# Patient Record
Sex: Female | Born: 2000
Health system: Southern US, Community
[De-identification: ages and names within clinical notes are randomized; demographics above are authoritative.]

## PROBLEM LIST (undated history)

## (undated) ENCOUNTER — Ambulatory Visit (HOSPITAL_COMMUNITY): Admission: EM | Payer: Medicaid Other | Source: Home / Self Care

## (undated) DIAGNOSIS — R569 Unspecified convulsions: Secondary | ICD-10-CM

## (undated) DIAGNOSIS — O139 Gestational [pregnancy-induced] hypertension without significant proteinuria, unspecified trimester: Secondary | ICD-10-CM

## (undated) HISTORY — DX: Gestational (pregnancy-induced) hypertension without significant proteinuria, unspecified trimester: O13.9

## (undated) HISTORY — PX: SP ARTHRO THUMB*R*: HXRAD215

## (undated) HISTORY — DX: Unspecified convulsions: R56.9

---
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
## 2019-03-30 ENCOUNTER — Encounter: Payer: Self-pay | Admitting: Student in an Organized Health Care Education/Training Program

## 2019-03-30 ENCOUNTER — Ambulatory Visit (INDEPENDENT_AMBULATORY_CARE_PROVIDER_SITE_OTHER): Payer: Medicaid Other | Admitting: Student in an Organized Health Care Education/Training Program

## 2019-03-30 ENCOUNTER — Other Ambulatory Visit: Payer: Self-pay

## 2019-03-30 VITALS — BP 122/82 | HR 74 | Ht 65.0 in | Wt 136.2 lb

## 2019-03-30 DIAGNOSIS — Z0000 Encounter for general adult medical examination without abnormal findings: Secondary | ICD-10-CM | POA: Diagnosis not present

## 2019-03-30 NOTE — Progress Notes (Signed)
Adolescent Well Care Visit Sonya Harris is a 18 y.o. female who is here for well care.    PCP:  Gifford Shave, MD   History was provided by the patient.  Confidentiality was discussed with the patient and, if applicable, with caregiver as well.  Current Issues: Current concerns include None.   Nutrition: Nutrition/Eating Behaviors: Patient eats whatever she craves. She eats some vegetables and meat. She feels she could eat more vegetables. She feels she tolerates milk less than previously, with some bloating associated with milk. She does eat greek yogurt Adequate calcium in diet?: yes Supplements/ Vitamins: no  Exercise/ Media: Play any Sports?/ Exercise: yes Screen Time:  > 2 hours-counseling provided Media Rules or Monitoring?: yes  Sleep:  Sleep: Endorses some nighttime awakenings, she does nap during the day sometimes. Discussed sleep hygiene.  Social Screening: Lives with:  Mom, sister, two brothers Parental relations:  good Activities, Work, and Research officer, political party?: yes, chores and work Concerns regarding behavior with peers?  no Stressors of note: no  Education: School Name: [employer] Grade: going to be a Glass blower/designer: doing well; no concerns School Behavior: doing well; no concerns  Menstruation:   Patient's last menstrual period was 03/24/2019 (exact date). Menstrual History: regular cycles between 26 and 28 days  Confidential Social History: Tobacco?  no Secondhand smoke exposure?  yes Drugs/ETOH?  no  Sexually Active?  No, but discloses that she may become sexually active soon. Offered contraceptive counseling. Patient prefers to continue using condoms. Information for LARCs provided. Pregnancy Prevention: Condoms  Safe at home, in school & in relationships?  Yes Safe to self?  Yes   Screenings: Patient has a dental home: yes  Physical Exam:  Vitals:   03/30/19 1343  BP: 122/82  Pulse: 74  SpO2: 99%  Weight: 136 lb 3.2 oz (61.8  kg)  Height: 5\' 5"  (1.651 m)   BP 122/82   Pulse 74   Ht 5\' 5"  (1.651 m)   Wt 136 lb 3.2 oz (61.8 kg)   LMP 03/24/2019 (Exact Date)   SpO2 99%   BMI 22.66 kg/m  Body mass index: body mass index is 22.66 kg/m. Blood pressure percentiles are not available for patients who are 18 years or older.  No exam data present  General Appearance:   alert, oriented, no acute distress  HENT: Normocephalic, no obvious abnormality, conjunctiva clear  Mouth:   Normal appearing teeth, no obvious discoloration, dental caries, or dental caps  Neck:   Supple; thyroid: no enlargement, symmetric, no tenderness/mass/nodules  Chest RRR, no m/r/g  Lungs:   Clear to auscultation bilaterally, normal work of breathing  Heart:   Regular rate and rhythm, S1 and S2 normal, no murmurs;   Abdomen:   Soft, non-tender, no mass, or organomegaly  GU genitalia not examined  Musculoskeletal:   Tone and strength strong and symmetrical, all extremities               Lymphatic:   No cervical adenopathy  Skin/Hair/Nails:   Skin warm, dry and intact, no rashes, no bruises or petechiae  Neurologic:   Strength, gait, and coordination normal and age-appropriate     Assessment and Plan:   17yoF doing well. Discussed sleep hygiene today. Discussed contraceptive options and gave resources. Patient prefers to use condoms for now because she is concerned about how other contraceptive options may make her period lighter or heavier. She was advised to use condoms with every episode of intercourse and follow up  in our office if she decides she would like additional contraception.  BMI is appropriate for age   Return in 1 year (on 03/29/2020).Everrett Coombe, MD

## 2019-03-30 NOTE — Patient Instructions (Signed)
It was a pleasure seeing you today in our clinic.   Our clinic's number is [phone]. Please call with questions or concerns about what we discussed today.  Be well, Dr. Jionni Helming   

## 2019-11-26 ENCOUNTER — Encounter (HOSPITAL_COMMUNITY): Payer: Self-pay

## 2019-11-26 ENCOUNTER — Other Ambulatory Visit: Payer: Self-pay

## 2019-11-26 ENCOUNTER — Ambulatory Visit (HOSPITAL_COMMUNITY)
Admission: EM | Admit: 2019-11-26 | Discharge: 2019-11-26 | Disposition: A | Discharge status: Home / Self Care | Payer: Medicaid Other | Attending: Family Medicine | Admitting: Family Medicine

## 2019-11-26 DIAGNOSIS — R0789 Other chest pain: Secondary | ICD-10-CM | POA: Diagnosis not present

## 2019-11-26 DIAGNOSIS — F419 Anxiety disorder, unspecified: Secondary | ICD-10-CM

## 2019-11-26 DIAGNOSIS — R519 Headache, unspecified: Secondary | ICD-10-CM

## 2019-11-26 MED ORDER — HYDROXYZINE HCL 10 MG PO TABS: 10.0000 mg | ORAL_TABLET | Freq: Three times a day (TID) | ORAL | 0 refills | Status: DC | PRN

## 2019-11-26 NOTE — Discharge Instructions (Addendum)
You are experiencing an increase in anxiety. This can cause chest pain, and limb tingling as well.  I have sent in hydroxyzine 10mg  for anxiety three times daily as needed.   Follow up with primary care if symptoms persist or worsen.

## 2019-11-26 NOTE — ED Triage Notes (Signed)
Pt presents with intermittent chest pain that has been on her right side and left side since yesterday.

## 2019-11-26 NOTE — ED Provider Notes (Signed)
[place]    CSN: [id number] Arrival date & time: 11/26/19  [id number]      History   Chief Complaint Chief Complaint  Patient presents with  . Chest Pain    HPI Sonya Harris is a 19 y.o. female.   Reports intermittent chest pain since yesterday.  Reports that episodes only last a few seconds.  Reports that pain is sharp and quick.  Reports increased stress, has been midterms, is very anxious.  Does not take anything for anxiety.  Reports an increase in headaches lately, daily.  Reports that she has been taking 400 mg ibuprofen, and that this knocks out her headache.  Denies dizziness, shortness of breath, radiating pain, body aches, nausea, vomiting, diarrhea, chills, fever, rash, other symptoms.  ROS per HPI  The history is provided by the patient.    History reviewed. No pertinent past medical history.  Patient Active Problem List   Diagnosis Date Noted  . Allergic rhinitis 05/01/2012  . ECZEMA, ATOPIC DERMATITIS 11/20/2006    History reviewed. No pertinent surgical history.  OB History   No obstetric history on file.      Home Medications    Prior to Admission medications   Medication Sig Start Date End Date Taking? Authorizing Provider  amoxicillin (AMOXIL) 500 MG capsule Take 1 capsule (500 mg total) by mouth 3 (three) times daily. 01/16/15   Billy Fischer, MD  hydrOXYzine (ATARAX/VISTARIL) 10 MG tablet Take 1 tablet (10 mg total) by mouth 3 (three) times daily as needed. 11/26/19   Faustino Congress, NP  ibuprofen (ADVIL,MOTRIN) 200 MG tablet Take 400 mg by mouth every 6 (six) hours as needed for fever.    [provider]    Family History History reviewed. No pertinent family history.  Social History Social History   Tobacco Use  . Smoking status: Passive Smoke Exposure - Never Smoker  . Smokeless tobacco: Never Used  Substance Use Topics  . Alcohol use: Not on file  . Drug use: Not on file     Allergies   Patient has no known  allergies.   Review of Systems Review of Systems   Physical Exam Triage Vital Signs ED Triage Vitals  Enc Vitals Group     BP 11/26/19 0850 (!) 135/98     Pulse Rate 11/26/19 0850 76     Resp 11/26/19 0850 18     Temp 11/26/19 0850 98.4 F (36.9 C)     Temp Source 11/26/19 0850 Oral     SpO2 11/26/19 0850 98 %     Weight --      Height --      Head Circumference --      Peak Flow --      Pain Score 11/26/19 0852 6     Pain Loc --      Pain Edu? --      Excl. in [place]? --    No data found.  Updated Vital Signs BP (!) 135/98 (BP Location: Left Arm)   Pulse 76   Temp 98.4 F (36.9 C) (Oral)   Resp 18   LMP 11/12/2019   SpO2 98%   Visual Acuity Right Eye Distance:   Left Eye Distance:   Bilateral Distance:    Right Eye Near:   Left Eye Near:    Bilateral Near:     Physical Exam Vitals and nursing note reviewed.  Constitutional:      General: She is not in acute distress.  Appearance: She is well-developed. She is not ill-appearing.  HENT:     Head: Normocephalic and atraumatic.  Eyes:     Conjunctiva/sclera: Conjunctivae normal.  Cardiovascular:     Rate and Rhythm: Normal rate and regular rhythm.     Heart sounds: Normal heart sounds. Heart sounds not distant. No murmur. No systolic murmur.  Pulmonary:     Effort: Pulmonary effort is normal. No respiratory distress.     Breath sounds: Normal breath sounds.  Chest:     Chest wall: No mass, deformity, tenderness, crepitus or edema. There is no dullness to percussion.  Abdominal:     Palpations: Abdomen is soft.     Tenderness: There is no abdominal tenderness.  Musculoskeletal:        General: Normal range of motion.     Cervical back: Neck supple.  Skin:    General: Skin is warm and dry.     Capillary Refill: Capillary refill takes less than 2 seconds.  Neurological:     General: No focal deficit present.     Mental Status: She is alert and oriented to person, place, and time.  Psychiatric:         Mood and Affect: Mood normal.        Behavior: Behavior normal.      UC Treatments / Results  Labs (all labs ordered are listed, but only abnormal results are displayed) Labs Reviewed - No data to display  EKG   Radiology No results found.  Procedures Procedures (including critical care time)  Medications Ordered in UC Medications - No data to display  Initial Impression / Assessment and Plan / UC Course  I have reviewed the triage vital signs and the nursing notes.  Pertinent labs & imaging results that were available during my care of the patient were reviewed by me and considered in my medical decision making (see chart for details).     Chest pain and headaches, increased anxiety.  Hydroxyzine 10 mg 3 times daily as needed for anxiety.  Chest pain not reproducible on exam.  Instructed to cut down on caffeine, drink more water, use exercise to help manage anxiety as well.  Instructed to follow-up with primary care provider instructed on when to go to the ER, with shortness of breath, unrelenting chest pain. Final Clinical Impressions(s) / UC Diagnoses   Final diagnoses:  Other chest pain  Anxiety  Nonintractable headache, unspecified chronicity pattern, unspecified headache type     Discharge Instructions     You are experiencing an increase in anxiety. This can cause chest pain, and limb tingling as well.  I have sent in hydroxyzine 10mg  for anxiety three times daily as needed.   Follow up with primary care if symptoms persist or worsen.     ED Prescriptions    Medication Sig Dispense Auth. Provider   hydrOXYzine (ATARAX/VISTARIL) 10 MG tablet Take 1 tablet (10 mg total) by mouth 3 (three) times daily as needed. 30 tablet Faustino Congress, NP     I have reviewed the PDMP during this encounter.   Faustino Congress, NP 11/26/19 [id number]

## 2020-02-01 ENCOUNTER — Emergency Department (HOSPITAL_COMMUNITY): Payer: Medicaid Other

## 2020-02-01 ENCOUNTER — Encounter (HOSPITAL_COMMUNITY): Payer: Self-pay | Admitting: Emergency Medicine

## 2020-02-01 ENCOUNTER — Emergency Department (HOSPITAL_COMMUNITY)
Admission: EM | Admit: 2020-02-01 | Discharge: 2020-02-01 | Disposition: A | Discharge status: Home / Self Care | Payer: Medicaid Other | Attending: Emergency Medicine | Admitting: Emergency Medicine

## 2020-02-01 DIAGNOSIS — Y92410 Unspecified street and highway as the place of occurrence of the external cause: Secondary | ICD-10-CM | POA: Diagnosis not present

## 2020-02-01 DIAGNOSIS — M25521 Pain in right elbow: Secondary | ICD-10-CM | POA: Diagnosis not present

## 2020-02-01 DIAGNOSIS — S0990XA Unspecified injury of head, initial encounter: Secondary | ICD-10-CM | POA: Diagnosis not present

## 2020-02-01 DIAGNOSIS — R519 Headache, unspecified: Secondary | ICD-10-CM | POA: Diagnosis present

## 2020-02-01 DIAGNOSIS — M79603 Pain in arm, unspecified: Secondary | ICD-10-CM | POA: Diagnosis not present

## 2020-02-01 DIAGNOSIS — Y999 Unspecified external cause status: Secondary | ICD-10-CM | POA: Insufficient documentation

## 2020-02-01 DIAGNOSIS — Z7722 Contact with and (suspected) exposure to environmental tobacco smoke (acute) (chronic): Secondary | ICD-10-CM | POA: Diagnosis not present

## 2020-02-01 DIAGNOSIS — Y93I9 Activity, other involving external motion: Secondary | ICD-10-CM | POA: Diagnosis not present

## 2020-02-01 DIAGNOSIS — M25421 Effusion, right elbow: Secondary | ICD-10-CM | POA: Diagnosis not present

## 2020-02-01 DIAGNOSIS — S8991XA Unspecified injury of right lower leg, initial encounter: Secondary | ICD-10-CM | POA: Diagnosis not present

## 2020-02-01 DIAGNOSIS — S199XXA Unspecified injury of neck, initial encounter: Secondary | ICD-10-CM | POA: Diagnosis not present

## 2020-02-01 DIAGNOSIS — S59911A Unspecified injury of right forearm, initial encounter: Secondary | ICD-10-CM | POA: Diagnosis not present

## 2020-02-01 DIAGNOSIS — M79604 Pain in right leg: Secondary | ICD-10-CM | POA: Diagnosis not present

## 2020-02-01 DIAGNOSIS — M79641 Pain in right hand: Secondary | ICD-10-CM | POA: Diagnosis not present

## 2020-02-01 DIAGNOSIS — R000 Tachycardia, unspecified: Secondary | ICD-10-CM | POA: Diagnosis not present

## 2020-02-01 DIAGNOSIS — R52 Pain, unspecified: Secondary | ICD-10-CM | POA: Diagnosis not present

## 2020-02-01 MED ORDER — IBUPROFEN 400 MG PO TABS
400.0000 mg | ORAL_TABLET | Freq: Once | ORAL | Status: AC | PRN
  Administered 2020-02-01: 400 mg via ORAL
  Filled 2020-02-01 (×2): qty 1
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
## 2020-02-01 NOTE — Discharge Planning (Signed)
RNCM escorted mom to conference room A and remained with her until family members arrived (sister and mom).

## 2020-02-01 NOTE — ED Notes (Signed)
Pt transported to CT and XR via wheelchair at this time.

## 2020-02-01 NOTE — ED Notes (Signed)
Ice pack applied to right forearm

## 2020-02-01 NOTE — Progress Notes (Signed)
Orthopedic Tech Progress Note Patient Details:  Sonya Harris 19-Mar-2001 [id number]  Ortho Devices Type of Ortho Device: Sling immobilizer Ortho Device/Splint Location: rue. dr requested sling immobilizer. tevin applied sling. Ortho Device/Splint Interventions: Ordered, Application, Adjustment   Post Interventions Patient Tolerated: Well Instructions Provided: Care of device, Adjustment of device   Sonya Harris 02/01/2020, 8:28 PM
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
## 2020-02-11 DIAGNOSIS — M25561 Pain in right knee: Secondary | ICD-10-CM | POA: Diagnosis not present

## 2020-02-11 DIAGNOSIS — S62211A Bennett's fracture, right hand, initial encounter for closed fracture: Secondary | ICD-10-CM | POA: Diagnosis not present

## 2020-02-11 DIAGNOSIS — M79641 Pain in right hand: Secondary | ICD-10-CM | POA: Diagnosis not present

## 2020-02-11 DIAGNOSIS — M79601 Pain in right arm: Secondary | ICD-10-CM | POA: Diagnosis not present

## 2020-02-15 DIAGNOSIS — S62211A Bennett's fracture, right hand, initial encounter for closed fracture: Secondary | ICD-10-CM | POA: Diagnosis not present

## 2020-02-16 DIAGNOSIS — Y999 Unspecified external cause status: Secondary | ICD-10-CM | POA: Diagnosis not present

## 2020-02-16 DIAGNOSIS — X58XXXA Exposure to other specified factors, initial encounter: Secondary | ICD-10-CM | POA: Diagnosis not present

## 2020-02-16 DIAGNOSIS — G8918 Other acute postprocedural pain: Secondary | ICD-10-CM | POA: Diagnosis not present

## 2020-02-16 DIAGNOSIS — S62211A Bennett's fracture, right hand, initial encounter for closed fracture: Secondary | ICD-10-CM | POA: Diagnosis not present

## 2020-02-29 DIAGNOSIS — S62211D Bennett's fracture, right hand, subsequent encounter for fracture with routine healing: Secondary | ICD-10-CM | POA: Diagnosis not present

## 2020-03-14 DIAGNOSIS — S62211D Bennett's fracture, right hand, subsequent encounter for fracture with routine healing: Secondary | ICD-10-CM | POA: Diagnosis not present

## 2020-03-30 DIAGNOSIS — S62211D Bennett's fracture, right hand, subsequent encounter for fracture with routine healing: Secondary | ICD-10-CM | POA: Diagnosis not present

## 2020-05-30 DIAGNOSIS — S62211D Bennett's fracture, right hand, subsequent encounter for fracture with routine healing: Secondary | ICD-10-CM | POA: Diagnosis not present

## 2021-05-11 ENCOUNTER — Encounter: Payer: Medicaid Other | Admitting: Student

## 2021-06-19 ENCOUNTER — Other Ambulatory Visit: Payer: Self-pay

## 2021-06-19 ENCOUNTER — Encounter: Payer: Self-pay | Admitting: Family Medicine

## 2021-06-19 ENCOUNTER — Other Ambulatory Visit (HOSPITAL_COMMUNITY)
Admission: RE | Admit: 2021-06-19 | Discharge: 2021-06-19 | Disposition: A | Discharge status: Home / Self Care | Payer: BC Managed Care – PPO | Source: Ambulatory Visit | Attending: Family Medicine | Admitting: Family Medicine

## 2021-06-19 ENCOUNTER — Ambulatory Visit (INDEPENDENT_AMBULATORY_CARE_PROVIDER_SITE_OTHER): Payer: BC Managed Care – PPO | Admitting: Family Medicine

## 2021-06-19 VITALS — BP 125/83 | HR 77 | Ht 65.0 in | Wt 145.2 lb

## 2021-06-19 DIAGNOSIS — Z113 Encounter for screening for infections with a predominantly sexual mode of transmission: Secondary | ICD-10-CM | POA: Insufficient documentation

## 2021-06-19 DIAGNOSIS — N898 Other specified noninflammatory disorders of vagina: Secondary | ICD-10-CM | POA: Diagnosis not present

## 2021-06-19 DIAGNOSIS — Z30011 Encounter for initial prescription of contraceptive pills: Secondary | ICD-10-CM | POA: Diagnosis not present

## 2021-06-19 LAB — POCT URINE PREGNANCY: Preg Test, Ur: NEGATIVE

## 2021-06-19 MED ORDER — NORGESTIMATE-ETH ESTRADIOL 0.25-35 MG-MCG PO TABS: 1.0000 | ORAL_TABLET | Freq: Every day | ORAL | 11 refills | Status: DC

## 2021-06-19 NOTE — Progress Notes (Signed)
    SUBJECTIVE:   CHIEF COMPLAINT / HPI:   Vaginal odor  Patient reports that she has noticed intermittent issues with a vaginal odor.  She is sexually active and uses protection intermittently.  She is not on any birth control.  Last sexual intercourse was 2 days ago without using protection.  Denies any known contact with STDs.  Reports she is not interested in getting pregnant and is interested in birth control today.  Physical Patient reports that she is here for annual physical but has no major concerns at this time other than the vaginal odor.  OBJECTIVE:   BP 125/83   Pulse 77   Ht 5\' 5"  (1.651 m)   Wt 145 lb 3.2 oz (65.9 kg)   LMP 06/08/2021 (Exact Date)   SpO2 100%   BMI 24.16 kg/m   Physical Exam Vitals reviewed. Exam conducted with a chaperone present.  Constitutional:      Appearance: Normal appearance.  HENT:     Head: Normocephalic and atraumatic.     Right Ear: Tympanic membrane normal.     Left Ear: Tympanic membrane normal.     Nose: Nose normal. No congestion.     Mouth/Throat:     Mouth: Mucous membranes are moist.     Pharynx: Oropharynx is clear.  Eyes:     Extraocular Movements: Extraocular movements intact.     Pupils: Pupils are equal, round, and reactive to light.  Cardiovascular:     Rate and Rhythm: Normal rate and regular rhythm.     Pulses: Normal pulses.     Heart sounds: Normal heart sounds.  Pulmonary:     Effort: Pulmonary effort is normal.     Breath sounds: Normal breath sounds.  Abdominal:     General: Abdomen is flat. Bowel sounds are normal.  Genitourinary:    General: Normal vulva.     Pubic Area: No rash.      Vagina: No vaginal discharge.     Comments: Mild amount of cervical ectopy noted Musculoskeletal:     Cervical back: Normal range of motion and neck supple.  Neurological:     Mental Status: She is alert.     ASSESSMENT/PLAN:   Encounter for initial prescription of contraceptive pills Patient interested in  contraceptive pills.  Discussed other forms of contraception and she would like to try the pill first and may switch to more long-term methods.  Pregnancy test today negative.  Most recent menstrual cycle was 9/16.  Screening for STD (sexually transmitted disease) Patient is sexually active without using safe sex practices consistently.  Screen for STDs today but did not check blood work because the patient deferred this.  We will call with results and call any prescriptions needed.  Patient had no further questions or concerns.     Gifford Shave, MD [place]

## 2021-06-19 NOTE — Assessment & Plan Note (Signed)
Patient interested in contraceptive pills.  Discussed other forms of contraception and she would like to try the pill first and may switch to more long-term methods.  Pregnancy test today negative.  Most recent menstrual cycle was 9/16.

## 2021-06-19 NOTE — Assessment & Plan Note (Signed)
Patient is sexually active without using safe sex practices consistently.  Screen for STDs today but did not check blood work because the patient deferred this.  We will call with results and call any prescriptions needed.  Patient had no further questions or concerns.

## 2021-06-19 NOTE — Patient Instructions (Signed)
It was great seeing you today.  We tested you for bacterial vaginosis as well as a yeast infection and STDs today.  We also checked a pregnancy test and started you on birth control.  You can have your Pap smear at your next physical in a year.  I will call you with the results if there are any abnormalities but if everything looks normal I will send you a MyChart message.  If you decide on another form of birth control please let [place] know.  If you have any questions or concerns please feel free to call the clinic.

## 2021-06-20 ENCOUNTER — Encounter: Payer: Self-pay | Admitting: Family Medicine

## 2021-06-20 LAB — CERVICOVAGINAL ANCILLARY ONLY
Bacterial Vaginitis (gardnerella): POSITIVE — AB
Candida Glabrata: NEGATIVE
Candida Vaginitis: NEGATIVE
Chlamydia: NEGATIVE
Comment: NEGATIVE
Comment: NEGATIVE
Comment: NEGATIVE
Comment: NEGATIVE
Comment: NEGATIVE
Comment: NORMAL
Neisseria Gonorrhea: NEGATIVE
Trichomonas: NEGATIVE

## 2021-06-20 MED ORDER — METRONIDAZOLE 500 MG PO TABS: 500.0000 mg | ORAL_TABLET | Freq: Two times a day (BID) | ORAL | 0 refills | Status: DC

## 2021-06-20 NOTE — Telephone Encounter (Signed)
Called patient regarding BV results. Sent prescription to patients pharmacy.

## 2021-07-27 ENCOUNTER — Ambulatory Visit (INDEPENDENT_AMBULATORY_CARE_PROVIDER_SITE_OTHER): Payer: Medicaid Other | Admitting: Family Medicine

## 2021-07-27 ENCOUNTER — Encounter: Payer: Self-pay | Admitting: Family Medicine

## 2021-07-27 ENCOUNTER — Other Ambulatory Visit: Payer: Self-pay

## 2021-07-27 VITALS — BP 122/80 | HR 83 | Temp 98.5°F | Ht 65.0 in | Wt 146.4 lb

## 2021-07-27 DIAGNOSIS — N926 Irregular menstruation, unspecified: Secondary | ICD-10-CM | POA: Diagnosis not present

## 2021-07-27 DIAGNOSIS — Z3481 Encounter for supervision of other normal pregnancy, first trimester: Secondary | ICD-10-CM

## 2021-07-27 DIAGNOSIS — Z3A01 Less than 8 weeks gestation of pregnancy: Secondary | ICD-10-CM

## 2021-07-27 LAB — POCT URINE PREGNANCY: Preg Test, Ur: POSITIVE — AB

## 2021-07-27 NOTE — Progress Notes (Signed)
    SUBJECTIVE:   CHIEF COMPLAINT / HPI:   Massie is a 20 yo who presents with positive home pregnancy test.  She is accompanied by partner who is the father of the baby.  She states that the pregnancy was not planned, and had intended to start oral contraception at the beginning of the month after her last cycle.  She does desire to keep pregnancy.  Reports LMP 9/28. Hx of regular cycles.  She states she documents in her phone when each cycle occurs. She endorses overall feeling ok, has had some nausea. Not interested in taking medication for it at this point, Has not started taking prenatal vitamin. Not currently taking any medication. Denies any significant PMH.    OBJECTIVE:   BP 122/80   Pulse 83   Temp 98.5 F (36.9 C)   Ht 5\' 5"  (1.651 m)   Wt 146 lb 6.4 oz (66.4 kg)   LMP 06/20/2021 (Exact Date)   SpO2 100%   BMI 24.36 kg/m    Physical exam  General: well appearing, NAD Cardiovascular: RRR, no murmurs Lungs: CTAB. Normal WOB Abdomen: soft, non-distended, non-tender Skin: warm, dry. No edema  ASSESSMENT/PLAN:   No problem-specific Assessment & Plan notes found for this encounter.   Positive pregnancy test  Confirmed in the clinic. Pregnancy not planned.  Had a thorough discussion about goals for pregnancy, her options, and patient desires to continue with pregnancy.  Ensured she is not taking any medication that is contraindicated in pregnancy. Did not perform STI testing since she will soon turn 20 and at follow up visit can get pap smear done in addition to GC/Chlamydia.  Discussed what to expect for initial OB appointment, return precautions, and gave her a handout of information regarding prenatal care. - obtain initial OB labs  - discussed starting prenatal vitamin - f/u in 4 weeks for initial OB visit     Shary Key, [place]

## 2021-07-27 NOTE — Patient Instructions (Signed)
It was great seeing you today!  Today you came in for positive home pregnancy test which we confirmed here at the clinic. We are obtaining your baseline labs which will be reviewed at your first OB appointment in 4 weeks, as well as lots of questions about medical history!  For now start taking a prenatal vitamin with at least 132 mcg of folic acid.   Please check-out at the front desk and schedule an appointment with your PCP for your initial OB appt in 4 weeks, but if you need to be seen earlier than that for any new issues we're happy to fit you in, just give [place] a call!  Feel free to call with any questions or concerns at any time, at [phone].   Take care,  Dr. Shary Key [facility]   Prenatal Care Prenatal care is health care during pregnancy. It helps you and your unborn baby (fetus) stay as healthy as possible. Prenatal care may be provided by a midwife, a family practice doctor, a IT consultant (nurse practitioner or physician assistant), or a childbirth and pregnancy doctor (obstetrician). How does this affect me? During pregnancy, you will be closely monitored for any new conditions that might develop. To lower your risk of pregnancy complications, you and your health care provider will talk about any underlying conditions you have. How does this affect my baby? Early and consistent prenatal care increases the chance that your baby will be healthy during pregnancy. Prenatal care lowers the risk that your baby will be: Born early (prematurely). Smaller than expected at birth (small for gestational age). What can I expect at the first prenatal care visit? Your first prenatal care visit will likely be the longest. You should schedule your first prenatal care visit as soon as you know that you are pregnant. Your first visit is a good time to talk about any questions or concerns you have about pregnancy. Medical history At your visit, you and  your health care provider will talk about your medical history, including: Any past pregnancies. Your family's medical history. Medical history of the baby's father. Any long-term (chronic) health conditions you have and how you manage them. Any surgeries or procedures you have had. Any current over-the-counter or prescription medicines, herbs, or supplements that you are taking. Other factors that could pose a risk to your baby, including: Exposure to harmful chemicals or radiation at work or at home. Any substance use, including tobacco, alcohol, and drug use. Your home setting and your stress levels, including: Exposure to abuse or violence. Household financial strain. Your daily health habits, including diet and exercise. Tests and screenings Your health care provider will: Measure your weight, height, and blood pressure. Do a physical exam, including a pelvic and breast exam. Perform blood tests and urine tests to check for: Urinary tract infection. Sexually transmitted infections (STIs). Low iron levels in your blood (anemia). Blood type and certain proteins on red blood cells (Rh antibodies). Infections and immunity to viruses, such as hepatitis B and rubella. HIV (human immunodeficiency virus). Discuss your options for genetic screening. Tips about staying healthy Your health care provider will also give you information about how to keep yourself and your baby healthy, including: Nutrition and taking vitamins. Physical activity. How to manage pregnancy symptoms such as nausea and vomiting (morning sickness). Infections and substances that may be harmful to your baby and how to avoid them. Food safety. Dental care. Working. Travel. Warning signs to watch for and  when to call your health care provider. How often will I have prenatal care visits? After your first prenatal care visit, you will have regular visits throughout your pregnancy. The visit schedule is often as  follows: Up to week 28 of pregnancy: once every 4 weeks. 28-36 weeks: once every 2 weeks. After 36 weeks: every week until delivery. Some women may have visits more or less often depending on any underlying health conditions and the health of the baby. Keep all follow-up and prenatal care visits. This is important. What happens during routine prenatal care visits? Your health care provider will: Measure your weight and blood pressure. Check for fetal heart sounds. Measure the height of your uterus in your abdomen (fundal height). This may be measured starting around week 20 of pregnancy. Check the position of your baby inside your uterus. Ask questions about your diet, sleeping patterns, and whether you can feel the baby move. Review warning signs to watch for and signs of labor. Ask about any pregnancy symptoms you are having and how you are dealing with them. Symptoms may include: Headaches. Nausea and vomiting. Vaginal discharge. Swelling. Fatigue. Constipation. Changes in your vision. Feeling persistently sad or anxious. Any discomfort, including back or pelvic pain. Bleeding or spotting. Make a list of questions to ask your health care provider at your routine visits. What tests might I have during prenatal care visits? You may have blood, urine, and imaging tests throughout your pregnancy, such as: Urine tests to check for glucose, protein, or signs of infection. Glucose tests to check for a form of diabetes that can develop during pregnancy (gestational diabetes mellitus). This is usually done around week 24 of pregnancy. Ultrasounds to check your baby's growth and development, to check for birth defects, and to check your baby's well-being. These can also help to decide when you should deliver your baby. A test to check for group B strep (GBS) infection. This is usually done around week 36 of pregnancy. Genetic testing. This may include blood, fluid, or tissue sampling, or  imaging tests, such as an ultrasound. Some genetic tests are done during the first trimester and some are done during the second trimester. What else can I expect during prenatal care visits? Your health care provider may recommend getting certain vaccines during pregnancy. These may include: A yearly flu shot (annual influenza vaccine). This is especially important if you will be pregnant during flu season. Tdap (tetanus, diphtheria, pertussis) vaccine. Getting this vaccine during pregnancy can protect your baby from whooping cough (pertussis) after birth. This vaccine may be recommended between weeks 27 and 36 of pregnancy. A COVID-19 vaccine. Later in your pregnancy, your health care provider may give you information about: Childbirth and breastfeeding classes. Choosing a health care provider for your baby. Umbilical cord banking. Breastfeeding. Birth control after your baby is born. The hospital labor and delivery unit and how to set up a tour. Registering at the hospital before you go into labor. Where to find more information Office on Women's Health: LegalWarrants.gl American Pregnancy Association: americanpregnancy.org March of Dimes: marchofdimes.org Summary Prenatal care helps you and your baby stay as healthy as possible during pregnancy. Your first prenatal care visit will most likely be the longest. You will have visits and tests throughout your pregnancy to monitor your health and your baby's health. Bring a list of questions to your visits to ask your health care provider. Make sure to keep all follow-up and prenatal care visits. This information is not intended to  replace advice given to you by your health care provider. Make sure you discuss any questions you have with your health care provider. Document Revised: 06/22/2020 Document Reviewed: 06/22/2020 Elsevier Patient Education  [place].

## 2021-07-29 LAB — CULTURE, OB URINE

## 2021-07-29 LAB — URINE CULTURE, OB REFLEX

## 2021-08-01 LAB — OBSTETRIC PANEL, INCLUDING HIV
Antibody Screen: NEGATIVE
Basophils Absolute: 0.1 10*3/uL (ref 0.0–0.2)
Basos: 1 %
EOS (ABSOLUTE): 0.1 10*3/uL (ref 0.0–0.4)
Eos: 1 %
HIV Screen 4th Generation wRfx: NONREACTIVE
Hematocrit: 34.9 % (ref 34.0–46.6)
Hemoglobin: 11.7 g/dL (ref 11.1–15.9)
Hepatitis B Surface Ag: NEGATIVE
Immature Grans (Abs): 0 10*3/uL (ref 0.0–0.1)
Immature Granulocytes: 0 %
Lymphocytes Absolute: 1.6 10*3/uL (ref 0.7–3.1)
Lymphs: 17 %
MCH: 26.6 pg (ref 26.6–33.0)
MCHC: 33.5 g/dL (ref 31.5–35.7)
MCV: 79 fL (ref 79–97)
Monocytes Absolute: 0.8 10*3/uL (ref 0.1–0.9)
Monocytes: 8 %
Neutrophils Absolute: 6.8 10*3/uL (ref 1.4–7.0)
Neutrophils: 73 %
Platelets: 278 10*3/uL (ref 150–450)
RBC: 4.4 x10E6/uL (ref 3.77–5.28)
RDW: 13 % (ref 11.7–15.4)
RPR Ser Ql: NONREACTIVE
Rh Factor: POSITIVE
Rubella Antibodies, IGG: 2.36 index (ref >0.99)
WBC: 9.3 10*3/uL (ref 3.4–10.8)

## 2021-08-01 LAB — HGB FRACTIONATION CASCADE
Hgb A2: 2.8 % (ref 1.8–3.2)
Hgb A: 97.2 % (ref 96.4–98.8)
Hgb F: 0 % (ref 0.0–2.0)
Hgb S: 0 %

## 2021-08-01 LAB — HCV INTERPRETATION

## 2021-08-01 LAB — VARICELLA ZOSTER ANTIBODY, IGG: Varicella zoster IgG: 263 index (ref >165)

## 2021-08-01 LAB — HCV AB W REFLEX TO QUANT PCR: HCV Ab: <0.1 s/co ratio (ref 0.0–0.9)

## 2021-08-07 ENCOUNTER — Ambulatory Visit: Payer: BC Managed Care – PPO | Admitting: Family Medicine

## 2021-08-09 ENCOUNTER — Other Ambulatory Visit (HOSPITAL_COMMUNITY)
Admission: RE | Admit: 2021-08-09 | Discharge: 2021-08-09 | Disposition: A | Discharge status: Home / Self Care | Payer: BC Managed Care – PPO | Source: Ambulatory Visit | Attending: Family Medicine | Admitting: Family Medicine

## 2021-08-09 ENCOUNTER — Encounter: Payer: Self-pay | Admitting: Family Medicine

## 2021-08-09 ENCOUNTER — Ambulatory Visit (INDEPENDENT_AMBULATORY_CARE_PROVIDER_SITE_OTHER): Payer: BC Managed Care – PPO | Admitting: Family Medicine

## 2021-08-09 ENCOUNTER — Other Ambulatory Visit (HOSPITAL_COMMUNITY): Payer: Self-pay

## 2021-08-09 ENCOUNTER — Other Ambulatory Visit: Payer: Self-pay

## 2021-08-09 VITALS — BP 118/78 | HR 87 | Wt 148.4 lb

## 2021-08-09 DIAGNOSIS — O26891 Other specified pregnancy related conditions, first trimester: Secondary | ICD-10-CM | POA: Insufficient documentation

## 2021-08-09 DIAGNOSIS — N898 Other specified noninflammatory disorders of vagina: Secondary | ICD-10-CM

## 2021-08-09 DIAGNOSIS — Z3401 Encounter for supervision of normal first pregnancy, first trimester: Secondary | ICD-10-CM

## 2021-08-09 LAB — POCT WET PREP (WET MOUNT)
Clue Cells Wet Prep Whiff POC: NEGATIVE
Trichomonas Wet Prep HPF POC: ABSENT

## 2021-08-09 MED ORDER — CLOTRIMAZOLE 1 % VA CREA
1.0000 | TOPICAL_CREAM | Freq: Every day | VAGINAL | 0 refills | Status: AC
  Filled 2021-08-09: qty 45, 7d supply, fill #0

## 2021-08-09 MED ORDER — PRENATAL MULTIVITAMIN CH: 1.0000 | ORAL_TABLET | Freq: Every day | ORAL | 2 refills | Status: DC

## 2021-08-09 NOTE — Progress Notes (Signed)
    SUBJECTIVE:   CHIEF COMPLAINT / HPI: vaginal discharge  Symptoms of yeast infection for about 1-2 weeks.  Itching and using aloe lotion. Reports thick white chunky vaginal discharge. No abdominal pain, gush of fluid or vaginal bleeding. Denies any urinary symptoms.  PERTINENT  PMH / PSH:  G1P0 at [redacted]w[redacted]d  OBJECTIVE:   BP 118/78   Pulse 87   Wt 148 lb 6.4 oz (67.3 kg)   LMP 06/20/2021 (Exact Date)   BMI 24.70 kg/m    General: Alert, no acute distress Pelvic Exam chaperoned by [facility]        External: normal female genitalia without lesions or masses        Vagina: normal without lesions or masses, thick white discharge noted in vaginal vault        Cervix: normal without lesions or masses          ASSESSMENT/PLAN:   Vaginal discharge during pregnancy in first trimester Pap smear: due at initial OB visit and will have reached age of screening at that time Samples for Wet prep, GC/Chlamydia obtained Positive for yeast Clotrimazole cream x 7 days Follow up at [facility] initial visit, scheduled for 12/13 with PCP     Carollee Leitz, MD [place]

## 2021-08-09 NOTE — Patient Instructions (Addendum)
Thank you for coming to see me today. It was a pleasure.   Congratulations on your pregnancy  Will MyChart you with the results of your testing today. If needing treatment I will send in a prescription to your pharmacy.  Please follow-up with PCP as needed  If you have any questions or concerns, please do not hesitate to call the office at [phone].  Best,   Carollee Leitz, MD

## 2021-08-10 LAB — CERVICOVAGINAL ANCILLARY ONLY
Chlamydia: NEGATIVE
Comment: NEGATIVE
Comment: NORMAL
Neisseria Gonorrhea: NEGATIVE

## 2021-08-11 ENCOUNTER — Encounter: Payer: Self-pay | Admitting: Family Medicine

## 2021-08-11 DIAGNOSIS — N898 Other specified noninflammatory disorders of vagina: Secondary | ICD-10-CM | POA: Insufficient documentation

## 2021-08-11 DIAGNOSIS — O26891 Other specified pregnancy related conditions, first trimester: Secondary | ICD-10-CM | POA: Insufficient documentation

## 2021-08-11 NOTE — Assessment & Plan Note (Signed)
Pap smear: due at initial OB visit and will have reached age of screening at that time Samples for Wet prep, GC/Chlamydia obtained Positive for yeast Clotrimazole cream x 7 days Follow up at [facility] initial visit, scheduled for 12/13 with PCP

## 2021-09-03 NOTE — Progress Notes (Signed)
Patient Name: Sonya Harris Date of Birth: July 06, 2001 [place] Initial Prenatal Visit  Sonya Harris is a 20 y.o. year old G1P0 at [redacted]w[redacted]d who presents for her initial prenatal visit. Pregnancy is not planned She reports breast tenderness, fatigue, frequent urination, morning sickness, nausea, and positive home pregnancy test. She is taking a prenatal vitamin.  She denies pelvic pain or vaginal bleeding.   Pregnancy Dating: The patient is dated by LMP.  LMP: 02/27/3709 Period is certain:  No  Periods were regular:  No  LMP was a typical period:  No. Had 5 day period that started 6th and ended the 10th. Bleeding 9/16-9/19.  Using hormonal contraception in 3 months prior to conception: Yes  Lab Review: Blood type: B Rh Status: + Antibody screen: Negative HIV: Positive RPR: Negative Hemoglobin electrophoresis reviewed: Yes Results of OB urine culture are: Negative Rubella: Immune Hep C Ab: Negative Varicella status is Immune  PMH: Reviewed and as detailed below: HTN: No  Gestational Hypertension/preeclampsia: No  Type 1 or 2 Diabetes: No  Depression:  No  Seizure disorder:  No VTE: No ,  History of STI No,  Abnormal Pap smear:  No, Genital herpes simplex:  No   PSH: Gynecologic Surgery:  no Surgical history reviewed, notable for: Thumb surgery   Obstetric History: Obstetric history tab updated and reviewed.  Summary of prior pregnancies: None  Cesarean delivery: No  Gestational Diabetes:  No Hypertension in pregnancy: No History of preterm birth: No History of LGA/SGA infant:  No History of shoulder dystocia: No Indications for referral were reviewed, and the patient has no obstetric indications for referral to [facility] at this time.   Social History: Partner's name: Rayshun  Tobacco use: No Alcohol use:  No Other substance use:  No  Current Medications:  Prenatal vitamin   Reviewed and appropriate in pregnancy.   Genetic  and Infection Screen: Flow Sheet Updated Yes  Prenatal Exam: Gen: Well nourished, well developed.  No distress.  Vitals noted. HEENT: Normocephalic, atraumatic.  Neck supple without cervical lymphadenopathy, thyromegaly or thyroid nodules.  Fair dentition. CV: RRR no murmur, gallops or rubs Lungs: CTA B.  Normal respiratory effort without wheezes or rales. Abd: soft, NTND. +BS.  Uterus not appreciated above pelvis. GU: Normal external female genitalia without lesions.  Nl vaginal, well rugated without lesions. No vaginal discharge.  Bimanual exam: No adnexal mass or TTP. No CMT.  Uterus size around the level of the pubis Ext: No clubbing, cyanosis or edema. Psych: Normal grooming and dress.  Not depressed or anxious appearing.  Normal thought content and process without flight of ideas or looseness of associations  Fetal heart tones: Appropriate 133  Assessment/Plan:  Sonya Harris is a 20 y.o. G1P0 at [redacted]w[redacted]d who presents to initiate prenatal care. She is doing well.  Current pregnancy issues include none.  Routine prenatal care: As dating is not reliable, a dating ultrasound has been ordered. Dating tab updated. Pre-pregnancy weight updated. Expected weight gain this pregnancy is 15-25 pounds Prenatal labs reviewed, notable for nothing abnoral. Indications for referral to [facility] were reviewed and the patient does not meet criteria for referral.  Medication list reviewed and updated.  Recommended patient see a dentist for regular care.  Bleeding and pain precautions reviewed. Importance of prenatal vitamins reviewed.  Genetic screening offered. Patient opted for: . The patient has the following indications for aspirinto begin 81 mg at 12-16 weeks: One high risk condition: no single high  risk condition  MORE than one moderate risk condition: nulliparity, obesity, and identifies as African American  Aspirin was  recommended today based upon above risk factors (one high risk condition or  more than one moderate risk factor)  The patient will not be age 92 or over at time of delivery. Referral to genetic counseling was not offered today.  The patient has the following risk factors for preexisting diabetes: BMI 25.46.  Not ordering early Glucola test at this time but could be considered.   Pregnancy Medical Home and PHQ-9 forms completed, problems noted: No  2. Pregnancy issues include the following which were addressed today:  Unclear dating, patient initially sure about her last menstrual period but when she brings up her menstrual cycle tracking app there is some discrepancies.  Have ordered dating ultrasound. Pap smear collected today as well as STD screening Patient should begin aspirin at 12 weeks.  Patient may be 12 weeks at this time but with unclear dating we will wait for dating ultrasound Panorama and Horizon genetic screening test collected today.  They do want to know the sex of the baby.   Follow up 4 weeks for next prenatal visit.

## 2021-09-04 ENCOUNTER — Other Ambulatory Visit (HOSPITAL_COMMUNITY)
Admission: RE | Admit: 2021-09-04 | Discharge: 2021-09-04 | Disposition: A | Discharge status: Home / Self Care | Payer: BC Managed Care – PPO | Source: Ambulatory Visit | Attending: Family Medicine | Admitting: Family Medicine

## 2021-09-04 ENCOUNTER — Other Ambulatory Visit: Payer: Self-pay

## 2021-09-04 ENCOUNTER — Ambulatory Visit (INDEPENDENT_AMBULATORY_CARE_PROVIDER_SITE_OTHER): Payer: BC Managed Care – PPO | Admitting: Family Medicine

## 2021-09-04 VITALS — BP 136/77 | HR 83 | Wt 153.0 lb

## 2021-09-04 DIAGNOSIS — O26891 Other specified pregnancy related conditions, first trimester: Secondary | ICD-10-CM | POA: Diagnosis not present

## 2021-09-04 DIAGNOSIS — Z3401 Encounter for supervision of normal first pregnancy, first trimester: Secondary | ICD-10-CM | POA: Diagnosis not present

## 2021-09-04 DIAGNOSIS — N898 Other specified noninflammatory disorders of vagina: Secondary | ICD-10-CM

## 2021-09-04 DIAGNOSIS — Z3400 Encounter for supervision of normal first pregnancy, unspecified trimester: Secondary | ICD-10-CM | POA: Insufficient documentation

## 2021-09-04 LAB — POCT WET PREP (WET MOUNT)
Clue Cells Wet Prep Whiff POC: NEGATIVE
Trichomonas Wet Prep HPF POC: ABSENT

## 2021-09-04 NOTE — Patient Instructions (Signed)
It was wonderful seeing you today!  Congratulations on your pregnancy.  We are collecting lab work for genetic screening and should also tell you the sex of the baby.  This may take a week or 2 to come back.  We also collected a Pap smear today.  We have scheduled you for a dating ultrasound and schedule your next OB appointment.  Below is some information on diet during pregnancy.  If you have any vaginal bleeding, gush of fluid, feel any contractions go be evaluated for maternal assessment unit in the bottom of the [facility].  I will see you in a month and I hope you have a great Christmas!  Discussed needing only 100-300 additional calories to support a baby, importance of taking prenatal vitamin for increased folic acid intake, avoidance of raw meat, deli meat, soft chesses, and large fish, avoidance of alcohol tobacco and drugs completely during pregnancy, as well as to limit caffeine, and wash fruits and vegetables prior to consumption.

## 2021-09-07 LAB — CYTOLOGY - PAP
Chlamydia: NEGATIVE
Comment: NEGATIVE
Comment: NORMAL
Diagnosis: NEGATIVE
Neisseria Gonorrhea: NEGATIVE

## 2021-09-13 ENCOUNTER — Ambulatory Visit
Admission: RE | Admit: 2021-09-13 | Discharge: 2021-09-13 | Disposition: A | Discharge status: Home / Self Care | Payer: BC Managed Care – PPO | Source: Ambulatory Visit | Attending: Family Medicine | Admitting: Family Medicine

## 2021-09-13 ENCOUNTER — Other Ambulatory Visit: Payer: Self-pay

## 2021-09-13 ENCOUNTER — Other Ambulatory Visit: Payer: Self-pay | Admitting: Family Medicine

## 2021-09-13 DIAGNOSIS — Z3401 Encounter for supervision of normal first pregnancy, first trimester: Secondary | ICD-10-CM | POA: Insufficient documentation

## 2021-09-17 ENCOUNTER — Encounter: Payer: Self-pay | Admitting: Family Medicine

## 2021-09-18 NOTE — Telephone Encounter (Signed)
Called patient. Reports that she was having mild abdominal cramping yesterday that resolved after 30 minutes.   No abdominal cramping or pain today. No vaginal bleeding. MAU precautions given.   Patient will contact our office if abdominal pain returns or if she begins experiencing new symptoms.   Talbot Grumbling, RN

## 2021-09-23 NOTE — L&D Delivery Note (Addendum)
Patient: Sonya Harris MRN: [id number]  GBS status: + , IAP given  Patient is a 21 y.o. now G1P1 s/p NSVD at [redacted]w[redacted]d who was admitted for IOL for gHTN. SROM 4h 338mrior to delivery with clear fluid.    Delivery Note At 10:29 AM a viable female was delivered via Vaginal, Spontaneous (Presentation: Right Occiput Anterior).    Head delivered ROA. No nuchal cord present. Shoulder and body delivered in usual fashion. Infant with spontaneous cry, placed on mother's abdomen, dried and bulb suctioned. Cord clamped x 2 after 1-minute delay, and cut by FOB member. Cord blood drawn. Placenta delivered spontaneously with gentle cord traction. Fundus firm with massage and Pitocin. Perineum inspected and found to have no laceration.  Placenta status: spontaneous, intact. Cord: 3 vessels with the following complications: None.  APGAR: 8, 9 ; weight pending.   Anesthesia: Epidural Episiotomy: None Lacerations:  None  Est. Blood Loss (mL):  175 mL  Mom to postpartum.  Baby to Couplet care / Skin to Skin.  ViGifford Shave/05/2022, 10:47 AM  Attestation of Supervision of Student:  I confirm that I have verified the information documented in the medical student's note and that I have also personally supervised the history, physical exam and all medical decision making activities.  I have verified that all services and findings are accurately documented in this student's note; and I agree with management and plan as outlined in the documentation. I have also made any necessary editorial changes.  There was a brisk bleed as placenta separated, but bleeding stopped with deep RN fundal rub. I was gloved at bedside for entirety of delivery of baby and placenta.  JaGabriel CarinaCNGalvestonor Wo[employer] Company[employer]oup 03/01/2022 3:32 PM

## 2021-10-02 ENCOUNTER — Telehealth: Payer: Self-pay | Admitting: Family Medicine

## 2021-10-02 NOTE — Telephone Encounter (Signed)
**  After Hours/ Emergency Line Call**  Received a call to report that Sonya Harris is experiencing headache and wanted to know if she could take tylenol. Denies nausea, vomiting, vision changes, and RUQ pain. She has 500 mg tablets at home. Recommended that 325mg  tablets instead. Also discussed supportive care such as increasing hydration, rest in a dark cool room with eyes closed. Red flags discussed.  Will forward to PCP.  Gerlene Fee, DO PGY-3, [facility] 10/02/2021 7:14 PM

## 2021-10-05 ENCOUNTER — Ambulatory Visit (INDEPENDENT_AMBULATORY_CARE_PROVIDER_SITE_OTHER): Payer: Medicaid Other | Admitting: Family Medicine

## 2021-10-05 ENCOUNTER — Other Ambulatory Visit: Payer: Self-pay

## 2021-10-05 VITALS — BP 124/68 | HR 78 | Wt 157.4 lb

## 2021-10-05 DIAGNOSIS — Z3401 Encounter for supervision of normal first pregnancy, first trimester: Secondary | ICD-10-CM | POA: Diagnosis not present

## 2021-10-05 LAB — POCT 1 HR PRENATAL GLUCOSE: Glucose 1 Hr Prenatal, POC: 139 mg/dL

## 2021-10-05 NOTE — Progress Notes (Signed)
°  Patient Name: Sonya Harris Date of Birth: Aug 17, 2001 [place] Prenatal Visit  Sonya Harris is a 21 y.o. G1P0 at [redacted]w[redacted]d here for routine follow up. She is dated by early ultrasound.  She reports no bleeding, no contractions, no cramping, and no leaking.  She denies vaginal bleeding.  See flow sheet for details.  Vitals:   10/05/21 1335  BP: 124/68  Pulse: 78     A/P: Pregnancy at [redacted]w[redacted]d.  Doing well.    Routine Prenatal Care:  Dating reviewed, dating tab is incorrect, discussed with preceptor and patient, updated appropriately.  Fetal heart tones Appropriate Influenza vaccine not administered as patient declined, will continue to discuss.   COVID vaccination was discussed and patient declines at this time, will continue to discuss.  The patient has the following indication for screening preexisting diabetes: BMI > 25 and high risk ethnicity (Latino, [place] American, Native American, [place], Asian Optometrist) . Anatomy ultrasound ordered to be scheduled at 18-20 weeks. Patient already completed genetic screening LR NIPS  Pregnancy education including expected weight gain in pregnancy, OTC medication use, continued use of prenatal vitamin, smoking cessation if applicable, and nutrition in pregnancy.   Bleeding and pain precautions reviewed. The patient has the following indications for aspirinto begin 81 mg at 12-16 weeks: One high risk condition: no single high risk condition  MORE than one moderate risk condition: nulliparity and identifies as African American  Aspirin was  recommended today based upon above risk factors (one high risk condition or more than one moderate risk factor)   2. Pregnancy issues include the following and were addressed as appropriate today:  Patient should be taking ASA 81 mg daily Panorama low risk female Pap smear within normal limits Anatomy scan ordered to be completed after [redacted] weeks gestation Patient agrees to 1 hour  early glucose tolerance test today.  She failed this and so a 3-hour GTT has been scheduled. Problem list  and pregnancy box updated: Yes.   Follow up 4 weeks.

## 2021-10-05 NOTE — Patient Instructions (Signed)
It was great seeing you today!  I have no concerns about your baby.  We have ordered and scheduled an anatomy ultrasound for you.  We are also doing early glucose tolerance test today.  If you have any bleeding, gush of fluid, contractions please be seen at the maternal assessment unit.  We have scheduled a follow-up visit with you on 2/7.  I hope you have a great afternoon!

## 2021-10-08 ENCOUNTER — Encounter: Payer: Self-pay | Admitting: Family Medicine

## 2021-10-12 ENCOUNTER — Other Ambulatory Visit: Payer: Medicaid Other

## 2021-10-24 ENCOUNTER — Other Ambulatory Visit: Payer: Self-pay | Admitting: *Deleted

## 2021-10-24 ENCOUNTER — Ambulatory Visit: Payer: Medicaid Other | Attending: Family Medicine

## 2021-10-24 ENCOUNTER — Other Ambulatory Visit: Payer: Self-pay

## 2021-10-24 DIAGNOSIS — Z3A19 19 weeks gestation of pregnancy: Secondary | ICD-10-CM | POA: Insufficient documentation

## 2021-10-24 DIAGNOSIS — Z362 Encounter for other antenatal screening follow-up: Secondary | ICD-10-CM

## 2021-10-24 DIAGNOSIS — O321XX0 Maternal care for breech presentation, not applicable or unspecified: Secondary | ICD-10-CM | POA: Insufficient documentation

## 2021-10-24 DIAGNOSIS — Z3401 Encounter for supervision of normal first pregnancy, first trimester: Secondary | ICD-10-CM | POA: Diagnosis not present

## 2021-10-24 DIAGNOSIS — Z363 Encounter for antenatal screening for malformations: Secondary | ICD-10-CM | POA: Insufficient documentation

## 2021-10-25 ENCOUNTER — Other Ambulatory Visit (INDEPENDENT_AMBULATORY_CARE_PROVIDER_SITE_OTHER): Payer: Medicaid Other

## 2021-10-25 ENCOUNTER — Other Ambulatory Visit: Payer: Self-pay

## 2021-10-25 DIAGNOSIS — Z3401 Encounter for supervision of normal first pregnancy, first trimester: Secondary | ICD-10-CM | POA: Diagnosis not present

## 2021-10-25 LAB — POCT CBG (FASTING - GLUCOSE)-MANUAL ENTRY: Glucose Fasting, POC: 84 mg/dL (ref 70–99)

## 2021-10-26 LAB — GESTATIONAL GLUCOSE TOLERANCE
Glucose, Fasting: 73 mg/dL (ref 70–94)
Glucose, GTT - 1 Hour: 125 mg/dL (ref 70–179)
Glucose, GTT - 2 Hour: 119 mg/dL (ref 70–154)
Glucose, GTT - 3 Hour: 97 mg/dL (ref 70–139)

## 2021-10-30 ENCOUNTER — Other Ambulatory Visit: Payer: Self-pay

## 2021-10-30 ENCOUNTER — Ambulatory Visit (INDEPENDENT_AMBULATORY_CARE_PROVIDER_SITE_OTHER): Payer: Medicaid Other | Admitting: Family Medicine

## 2021-10-30 ENCOUNTER — Other Ambulatory Visit (HOSPITAL_COMMUNITY): Payer: Self-pay

## 2021-10-30 DIAGNOSIS — Z3402 Encounter for supervision of normal first pregnancy, second trimester: Secondary | ICD-10-CM

## 2021-10-30 MED ORDER — ASPIRIN 81 MG PO TBEC
81.0000 mg | DELAYED_RELEASE_TABLET | Freq: Every day | ORAL | 11 refills | Status: DC
  Filled 2021-10-30: qty 30, 30d supply, fill #0

## 2021-10-30 NOTE — Patient Instructions (Addendum)
It was wonderful seeing you today.  I am glad that your 3-hour glucose tolerance test was normal.  Be sure to go to the follow-up anatomy scan so they can see the structures that they were unable to see at the last ultrasound.  If you have any contractions, bleeding, gush of fluid please go be evaluated in maternal assessment unit.  Your next appointment has been scheduled for 930 on 3/9 with our faculty OB clinic.  Please be sure to get here 10 minutes early.  Please start taking your daily 81 mg aspirin.  I sent a prescription for this to the [facility] outpatient pharmacy.  Let me know if you need anything.  Have a great day!

## 2021-10-30 NOTE — Progress Notes (Signed)
°  Patient Name: Sonya Harris Date of Birth: [birth year] [place] Prenatal Visit  Sonya Harris is a 21 y.o. G1P0 at [redacted]w[redacted]d here for routine follow up. She is dated by early ultrasound.  She reports no bleeding, no contractions, no cramping, and no leaking.  She denies vaginal bleeding.  See flow sheet for details.  Vitals:   10/30/21 0934  BP: 135/72  Pulse: 88     A/P: Pregnancy at [redacted]w[redacted]d.  Doing well.    Routine Prenatal Care:  Dating reviewed, dating tab is correct Fetal heart tones Appropriate 153 Influenza vaccine not administered as patient declined, will continue to discuss.   COVID vaccination was discussed and is not interested in the booster.  The patient has the following indication for screening preexisting diabetes: BMI > 25 and high risk ethnicity (Latino, [place] American, Native American, [place], Asian Optometrist) .Failed 1 hour and passed 3 hour  Anatomy ultrasound completed. Repeat scan scheduled to look at structures not visualized Patient already had genetic screening with results being low risk nips. Pregnancy education including expected weight gain in pregnancy, OTC medication use, continued use of prenatal vitamin, smoking cessation if applicable, and nutrition in pregnancy.   Bleeding and pain precautions reviewed. The patient has the following indications for aspirinto begin 81 mg at 12-16 weeks: One high risk condition: no single high risk condition  MORE than one moderate risk condition: nulliparity and identifies as African American  Aspirin was  recommended today based upon above risk factors (one high risk condition or more than one moderate risk factor)   2. Pregnancy issues include the following and were addressed as appropriate today:  Patient meets requirements for 81 mg aspirins.  This was prescribed today. Panorama showing a low risk female Patient failed 1 hour early glucose tolerance test but passed 3-hour glucose  tolerance test.  She will need to repeat it between 24 and 28 weeks Anatomy scan showing normal anatomy but some structures not visualized, scheduled for follow-up anatomy scan Problem list  and pregnancy box updated: Yes.   Follow up 4 weeks.

## 2021-11-23 ENCOUNTER — Other Ambulatory Visit: Payer: Self-pay

## 2021-11-23 ENCOUNTER — Ambulatory Visit: Payer: Medicaid Other | Attending: Obstetrics and Gynecology

## 2021-11-23 ENCOUNTER — Other Ambulatory Visit: Payer: Self-pay | Admitting: *Deleted

## 2021-11-23 ENCOUNTER — Ambulatory Visit: Payer: Medicaid Other | Admitting: *Deleted

## 2021-11-23 VITALS — BP 122/68 | HR 87

## 2021-11-23 DIAGNOSIS — Z3A24 24 weeks gestation of pregnancy: Secondary | ICD-10-CM | POA: Diagnosis not present

## 2021-11-23 DIAGNOSIS — D259 Leiomyoma of uterus, unspecified: Secondary | ICD-10-CM

## 2021-11-23 DIAGNOSIS — Z362 Encounter for other antenatal screening follow-up: Secondary | ICD-10-CM

## 2021-11-23 DIAGNOSIS — O3412 Maternal care for benign tumor of corpus uteri, second trimester: Secondary | ICD-10-CM | POA: Diagnosis not present

## 2021-11-29 ENCOUNTER — Other Ambulatory Visit: Payer: Self-pay

## 2021-11-29 ENCOUNTER — Ambulatory Visit (INDEPENDENT_AMBULATORY_CARE_PROVIDER_SITE_OTHER): Payer: Medicaid Other | Admitting: Family Medicine

## 2021-11-29 VITALS — BP 126/80 | HR 81 | Wt 168.8 lb

## 2021-11-29 DIAGNOSIS — Z3402 Encounter for supervision of normal first pregnancy, second trimester: Secondary | ICD-10-CM

## 2021-11-29 NOTE — Progress Notes (Signed)
?  [place] Prenatal Visit ? ?Sonya Harris is a 21 y.o. G1P0 at 57w1dhere for routine follow up. She is dated by early ultrasound.  She reports backache if she stands or sits too long. She reports good fetal movement. No bleeding, loss of fluid, contractions. See flow sheet for details. ?Vitals:  ? 11/29/21 0939  ?BP: 137/79  ?Pulse: 81  ? ? ? ?A/P: Pregnancy at 220w1d Doing well.   ?Dating reviewed, dating tab is correct. ?Fetal heart tones appropriate. ?Fundal height appropriate. ?Anatomy ultrasound reviewed and notable for 3-4 cm fibroid, repeating USKorean 3/31 as recommended.  ?Influenza vaccine extensively discussed, will consider at next visit.  ?Received both doses of COVID vaccine previously. COVID booster was discussed extensively and recommended, patient will consider at next visit.  ?Patient had prior 3 h glucola screening for preexisting diabetes and passed, repeat 1 hr glucola at next visit.  ?Pregnancy education provided on the following topics: fetal growth and movement, ultrasound assessment, and upcoming laboratory assessment.   ?Preterm labor precautions given.  ? ?2. Pregnancy issues include the following and were addressed as appropriate today: ? ? Extensively discussed vaccination administration, consider addressing again at next visit. ? Continue aspirin therapy for preeclampsia prevention.  ? Problem list and pregnancy box updated: Yes.  ? ?Of note: Patient has a history of seizures that occurred once at the age of 1.33? ?Follow up 4 weeks, will need repeat 1 hr glucola.  ? ? ? ?

## 2021-11-29 NOTE — Patient Instructions (Addendum)
It was great seeing you today! ? ?I am glad your pregnancy is going well! ? ?Please follow up at your next scheduled appointment in 4 weeks, if anything arises between now and then, please don't hesitate to contact our office. Please be prepared to get a 1 hour glucola testing at your next visit.  ? ?Go to the MAU at [place] at [facility] if: ?You have cramping/contractions that do not go away with drinking water ?Your water breaks.  Sometimes it is a big gush of fluid, sometimes it is just a trickle that keeps getting your underwear wet or running down your legs ?You have vaginal bleeding.    ?You do not feel your baby moving like normal.  If you do not, get something to eat and drink and lay down and focus on feeling your baby move. If your baby is still not moving like normal, you should go to MAU.  ? ? ?Thank you for allowing [place] to be a part of your medical care! ? ?Thank you, ?Dr. Larae Grooms  ?

## 2021-12-03 ENCOUNTER — Encounter: Payer: Medicaid Other | Admitting: Family Medicine

## 2021-12-21 ENCOUNTER — Other Ambulatory Visit: Payer: Self-pay | Admitting: *Deleted

## 2021-12-21 ENCOUNTER — Ambulatory Visit: Payer: Medicaid Other | Attending: Obstetrics

## 2021-12-21 ENCOUNTER — Ambulatory Visit: Payer: Medicaid Other | Admitting: *Deleted

## 2021-12-21 VITALS — BP 140/68 | HR 70

## 2021-12-21 DIAGNOSIS — O3412 Maternal care for benign tumor of corpus uteri, second trimester: Secondary | ICD-10-CM | POA: Diagnosis not present

## 2021-12-21 DIAGNOSIS — D259 Leiomyoma of uterus, unspecified: Secondary | ICD-10-CM

## 2021-12-21 DIAGNOSIS — Z362 Encounter for other antenatal screening follow-up: Secondary | ICD-10-CM

## 2021-12-21 DIAGNOSIS — Z3A27 27 weeks gestation of pregnancy: Secondary | ICD-10-CM | POA: Diagnosis not present

## 2021-12-21 DIAGNOSIS — Z3689 Encounter for other specified antenatal screening: Secondary | ICD-10-CM

## 2021-12-24 ENCOUNTER — Ambulatory Visit (INDEPENDENT_AMBULATORY_CARE_PROVIDER_SITE_OTHER): Payer: Medicaid Other | Admitting: Family Medicine

## 2021-12-24 ENCOUNTER — Other Ambulatory Visit: Payer: Self-pay

## 2021-12-24 VITALS — BP 122/87 | HR 109 | Wt 174.6 lb

## 2021-12-24 DIAGNOSIS — Z3402 Encounter for supervision of normal first pregnancy, second trimester: Secondary | ICD-10-CM

## 2021-12-24 DIAGNOSIS — N898 Other specified noninflammatory disorders of vagina: Secondary | ICD-10-CM

## 2021-12-24 LAB — POCT WET PREP (WET MOUNT)
Clue Cells Wet Prep Whiff POC: NEGATIVE
Trichomonas Wet Prep HPF POC: ABSENT

## 2021-12-24 NOTE — Patient Instructions (Addendum)
It was wonderful seeing you today!  Your baby appears to be doing well and I have no major concerns.  Regarding the left hip pain it is okay for you to set/linear back the right side.  Regarding your discharge we checked for yeast infection but did not see 1.  This is most likely just physiologic discharge of pregnancy but if it continues to be an issue and you like to be tested for other STDs please let me know and we can do that.  If you have any contractions, bleeding, gush of fluid between now and your next visit please be assessed on maternal assessment unit.  I hope you have a wonderful day! ? ? ?

## 2021-12-24 NOTE — Progress Notes (Signed)
  [place] Prenatal Visit  Sonya Harris is a 21 y.o. G1P0 at 41w5dhere for routine follow up. She is dated by early ultrasound.  She reports backache, no bleeding, no contractions, no cramping, no leaking, and vaginal irritation. She reports fetal movement. Denies vaginal bleeding, loss of fluid, or contractions.  See flow sheet for details.  A/P: Pregnancy at [redacted]w[redacted]d Doing well.   Dating reviewed, dating tab is correct Fetal heart tones Appropriate 147 bpm Fundal height within expected range.  27 cm  Influenza vaccine not administered as patient declined, will continue to discuss.   COVID vaccination was discussed and not interested at this time.  Screening for gestational diabetes is going to be collected lab visit this Thursday.  28-week labs will also be collected then. Pregnancy education completed including: fetal growth, breastfeeding, contraception, and expected weight gain in pregnancy.   The patient does not have a history of Cesarean delivery and [facility] indicated Preterm labor, bleeding, and pain precautions given.    2. Pregnancy issues include the following and were addressed as appropriate today:   Routine prenatal care Patient is going to continue aspirin for preeclampsia prevention Discussed COVID booster and patient declines at this time. Patient reported vaginal irritation intermittently for the past 2 months.  Also reports increased discharge.  Is concerned she may have a yeast infection.  Wet prep today did not show yeast infection.  Physical exam showed physiologic discharge of pregnancy.  Discussed testing for other STDs but patient is not interested at this time.  We will continue to monitor symptoms and consider further testing at future visits. Patient had prior elevated 1 hour GTT with normal 3-hour.  We are going to repeat a 1 hour GTT on Thursday of this week Patient has history of seizures as a child, most  likely febrile in nature.  No seizures since that time Patient has a uterine fibroid (3-4 cm) noted on ultrasound.  She follows with MFM and has repeat ultrasound scheduled in 5-6 weeks  Problem list and pregnancy box updated: Yes.   Follow up 4 weeks.

## 2021-12-27 ENCOUNTER — Other Ambulatory Visit (INDEPENDENT_AMBULATORY_CARE_PROVIDER_SITE_OTHER): Payer: Medicaid Other

## 2021-12-27 DIAGNOSIS — Z3402 Encounter for supervision of normal first pregnancy, second trimester: Secondary | ICD-10-CM | POA: Diagnosis not present

## 2021-12-27 LAB — POCT 1 HR PRENATAL GLUCOSE: Glucose 1 Hr Prenatal, POC: 125 mg/dL

## 2021-12-28 LAB — CBC
Hematocrit: 34.6 % (ref 34.0–46.6)
Hemoglobin: 11.5 g/dL (ref 11.1–15.9)
MCH: 26.5 pg — ABNORMAL LOW (ref 26.6–33.0)
MCHC: 33.2 g/dL (ref 31.5–35.7)
MCV: 80 fL (ref 79–97)
Platelets: 202 10*3/uL (ref 150–450)
RBC: 4.34 x10E6/uL (ref 3.77–5.28)
RDW: 12.1 % (ref 11.7–15.4)
WBC: 10.2 10*3/uL (ref 3.4–10.8)

## 2021-12-28 LAB — RPR: RPR Ser Ql: NONREACTIVE

## 2021-12-28 LAB — HIV ANTIBODY (ROUTINE TESTING W REFLEX): HIV Screen 4th Generation wRfx: NONREACTIVE

## 2022-01-07 ENCOUNTER — Ambulatory Visit (INDEPENDENT_AMBULATORY_CARE_PROVIDER_SITE_OTHER): Payer: Medicaid Other | Admitting: Family Medicine

## 2022-01-07 DIAGNOSIS — Z3402 Encounter for supervision of normal first pregnancy, second trimester: Secondary | ICD-10-CM

## 2022-01-07 NOTE — Progress Notes (Signed)
?  [place] Prenatal Visit ? ?Sonya Harris is a 21 y.o. G1P0 at 56w5dhere for routine follow up. She is dated by early ultrasound.  She reports no bleeding, no contractions, no cramping, and no leaking. She reports fetal movement. She denies vaginal bleeding, contractions, or loss of fluid. See flow sheet for details. ? ?Vitals:  ? 01/07/22 1339  ?BP: 112/60  ?Pulse: 80  ? ? ? ? ?A/P: Pregnancy at 223w5d Doing well.   ?Routine prenatal care:  ?Dating reviewed, dating tab is correct ?Fetal heart tones Appropriate151 ?Fundal height within expected range.  30 cm ?Infant feeding choice: Both  ?Contraception choice: Pills  ?Infant circumcision desired not applicable ? ?The patient does not have a history of Cesarean delivery and no referral to [facility] indicated ?Influenza vaccine not administered as not influenza season.   ?Tdap was not given today. Plan to get at next visit. ?1 hour glucola, CBC, RPR, and HIV were not obtained today.   ? ?Rh status was reviewed and patient does not need Rhogam.  Rhogam was not given today.  ?Pregnancy medical home and PHQ-9 forms were done today and reviewed.   ?Childbirth and education classes were offered. ?Pregnancy education regarding benefits of breastfeeding, contraception, fetal growth, expected weight gain, and safe infant sleep were discussed.  ?Preterm labor and fetal movement precautions reviewed. ? ?2. Pregnancy issues include the following and were addressed as appropriate today: ? ? Patient will continue aspirin for preeclampsia prevention ?Patient continues to decline COVID booster ?Patient had 28-week labs including repeat 1 hour glucose tolerance test which was all normal at previous visit ?Patient has history of seizures as a child, most likely febrile in nature.  No seizures since that time.  We will continue to monitor ?Patient with known uterine fibroid (3-4 cm) noted on ultrasound.  Has been seen by MFM and repeat  ultrasound scheduled ? Problem list and pregnancy box updated: Yes.  ? ?Follow up 2 weeks.  ?

## 2022-01-07 NOTE — Patient Instructions (Addendum)
It was good seeing you today.  I am glad you are doing well.  There is no major test we need to perform today but you do need your Tdap vaccine.  We can do this at the next visit.  Otherwise you are doing great and I have no concerns.  I will see you in 2 weeks for your routine prenatal visit.  If you have any leaking of fluids, contractions, vaginal bleeding between now and then please be assessed in the maternal assessment unit.  I hope you have a wonderful day! ? ?Child birthing classes can be found at [email or web address] ? ?

## 2022-01-10 ENCOUNTER — Inpatient Hospital Stay (HOSPITAL_COMMUNITY)
Admission: AD | Admit: 2022-01-10 | Discharge: 2022-01-10 | Disposition: A | Discharge status: Home / Self Care | Payer: Medicaid Other | Attending: Obstetrics and Gynecology | Admitting: Obstetrics and Gynecology

## 2022-01-10 ENCOUNTER — Encounter (HOSPITAL_COMMUNITY): Payer: Self-pay | Admitting: Obstetrics and Gynecology

## 2022-01-10 DIAGNOSIS — Z3A30 30 weeks gestation of pregnancy: Secondary | ICD-10-CM | POA: Diagnosis not present

## 2022-01-10 DIAGNOSIS — Z3689 Encounter for other specified antenatal screening: Secondary | ICD-10-CM | POA: Diagnosis not present

## 2022-01-10 DIAGNOSIS — O368130 Decreased fetal movements, third trimester, not applicable or unspecified: Secondary | ICD-10-CM | POA: Insufficient documentation

## 2022-01-10 NOTE — MAU Provider Note (Signed)
?History  ?  ? ?CSN: [id number] ? ?Arrival date and time: 01/10/22 0845 ? ? Event Date/Time  ? First Provider Initiated Contact with Patient 01/10/22 0920   ?  ? ?Chief Complaint  ?Patient presents with  ? Decreased Fetal Movement  ? ?21 year old G1 at 30.1 weeks presenting with decreased fetal movement.  Reports onset this a.m.  She felt a few small movements but this was not usual for her baby.  No other concerns or complaints. ? ? ?OB History   ? ? Gravida  ?1  ? Para  ?   ? Term  ?   ? Preterm  ?   ? AB  ?   ? Living  ?   ?  ? ? SAB  ?   ? IAB  ?   ? Ectopic  ?   ? Multiple  ?   ? Live Births  ?   ?   ?  ?  ? ? ?Past Medical History:  ?Diagnosis Date  ? Seizures ([place])   ? ? ?Past Surgical History:  ?Procedure Laterality Date  ? SP ARTHRO THUMB*R*    ? ? ?Family History  ?Problem Relation Age of Onset  ? Hypertension Mother   ? Hypertension Maternal Aunt   ? ? ?Social History  ? ?Tobacco Use  ? Smoking status: Never  ?  Passive exposure: Yes  ? Smokeless tobacco: Never  ?Vaping Use  ? Vaping Use: Never used  ?Substance Use Topics  ? Alcohol use: Not Currently  ?  Comment: not while preg  ? Drug use: Never  ? ? ?Allergies: No Known Allergies ? ?Medications Prior to Admission  ?Medication Sig Dispense Refill Last Dose  ? aspirin 81 MG EC tablet Take 1 tablet (81 mg total) by mouth daily. Swallow whole. 30 tablet 11   ? Prenatal Vit-Fe Fumarate-FA (PRENATAL MULTIVITAMIN) TABS tablet Take 1 tablet by mouth daily at 12 noon. 100 tablet 2   ? ? ?Review of Systems  ?Gastrointestinal:  Negative for abdominal pain.  ?Genitourinary:  Negative for vaginal bleeding and vaginal discharge.  ?Physical Exam  ? ?Blood pressure 125/70, pulse 92, temperature 98.4 ?F (36.9 ?C), temperature source Oral, resp. rate 16, last menstrual period 06/08/2021, SpO2 99 %. ? ?Physical Exam ?Vitals and nursing note reviewed.  ?Constitutional:   ?   General: She is not in acute distress. ?   Appearance: Normal appearance.  ?HENT:  ?   Head:  Normocephalic and atraumatic.  ?Cardiovascular:  ?   Rate and Rhythm: Normal rate.  ?Pulmonary:  ?   Effort: Pulmonary effort is normal. No respiratory distress.  ?Abdominal:  ?   Palpations: Abdomen is soft.  ?   Tenderness: There is no abdominal tenderness.  ?Musculoskeletal:     ?   General: Normal range of motion.  ?   Cervical back: Normal range of motion.  ?Skin: ?   General: Skin is warm and dry.  ?Neurological:  ?   General: No focal deficit present.  ?   Mental Status: She is alert and oriented to person, place, and time.  ?Psychiatric:     ?   Mood and Affect: Mood normal.     ?   Behavior: Behavior normal.  ?EFM: 140 bpm, mod variability, + accels, no decels ?Toco: none ? ?MAU Course  ?Procedures ? ?MDM ?Feeling good fetal movement since EFM applied.  NST reactive.  Marked >20 FM in first hour.  Discussed fetal movement counts.  Stable  for discharge. ? ?Assessment and Plan  ? ?1. [redacted] weeks gestation of pregnancy   ?2. NST (non-stress test) reactive   ? ?Discharge home ?Follow-up at [facility] as scheduled ?FMCs ? ?Allergies as of 01/10/2022   ?No Known Allergies ?  ? ?  ?Medication List  ?  ? ?TAKE these medications   ? ?aspirin 81 MG EC tablet ?Take 1 tablet (81 mg total) by mouth daily. Swallow whole. ?  ?prenatal multivitamin Tabs tablet ?Take 1 tablet by mouth daily at 12 noon. ?  ? ?  ? ? ? ? ?Julianne Handler, CNM ?01/10/2022, 9:20 AM  ?

## 2022-01-10 NOTE — MAU Note (Signed)
...  Sonya Harris is a 21 y.o. at 61w1dhere in MAU reporting: DFM. She states she usually wakes up to fetal movement around 0200 but she didn't wake up to this. She states she woke up around 0700 and felt "a tiny" movement but no movement since then. Patient felt one movement while applying FHR monitors. Denies pain, VB, or LOF.  ? ?Pain score: Denies pain. ? ?FHT: 140 initial external ? ? ? ? ?

## 2022-01-10 NOTE — MAU Provider Note (Addendum)
?History  ?  ? ?CSN: [id number] ? ?Arrival date and time: 01/10/22 0845 ? ? None  ?  ? ?Chief Complaint  ?Patient presents with  ? Decreased Fetal Movement  ? ?HPI ? ?Sonya Harris is a 21 y.o. F G1P0 w/ pmh febrile seizure who presents with DFM since this morning. Normally when she first wakes up in the morning, she feels a lot of strong movement but this morning, she only felt subtle movements in one part of her abdomen. She has no h/o spontaneous abortions or premature deliveries. No family h/o spontaneous abortions. She traveled to [place] last week and has not been around any sick contacts. She denies any fever, chills, chest pain, SOB, weakness, dizziness, headache, abdominal pain, changes in her bowel movements.  ? ?OB History   ? ? Gravida  ?1  ? Para  ?   ? Term  ?   ? Preterm  ?   ? AB  ?   ? Living  ?   ?  ? ? SAB  ?   ? IAB  ?   ? Ectopic  ?   ? Multiple  ?   ? Live Births  ?   ?   ?  ?  ? ? ?Past Medical History:  ?Diagnosis Date  ? Seizures ([place])   ? ? ?Past Surgical History:  ?Procedure Laterality Date  ? SP ARTHRO THUMB*R*    ? ? ?Family History  ?Problem Relation Age of Onset  ? Hypertension Mother   ? Hypertension Maternal Aunt   ? ? ?Social History  ? ?Tobacco Use  ? Smoking status: Never  ?  Passive exposure: Yes  ? Smokeless tobacco: Never  ?Vaping Use  ? Vaping Use: Never used  ?Substance Use Topics  ? Alcohol use: Not Currently  ?  Comment: not while preg  ? Drug use: Never  ? ? ?Allergies: No Known Allergies ? ?Medications Prior to Admission  ?Medication Sig Dispense Refill Last Dose  ? aspirin 81 MG EC tablet Take 1 tablet (81 mg total) by mouth daily. Swallow whole. 30 tablet 11   ? Prenatal Vit-Fe Fumarate-FA (PRENATAL MULTIVITAMIN) TABS tablet Take 1 tablet by mouth daily at 12 noon. 100 tablet 2   ? ? ?Review of Systems  ?Constitutional: Negative.   ?HENT: Negative.    ?Respiratory: Negative.    ?Cardiovascular: Negative.   ?Gastrointestinal: Negative.   ?Genitourinary: Negative.    ?Musculoskeletal: Negative.   ?Neurological: Negative.   ?Physical Exam  ? ?Blood pressure 125/70, pulse 92, temperature 98.4 ?F (36.9 ?C), temperature source Oral, resp. rate 16, last menstrual period 06/08/2021, SpO2 99 %. ? ?Physical Exam ?Constitutional:   ?   Appearance: Normal appearance.  ?HENT:  ?   Head: Normocephalic and atraumatic.  ?   Nose: Nose normal.  ?Cardiovascular:  ?   Rate and Rhythm: Normal rate and regular rhythm.  ?   Pulses: Normal pulses.  ?   Heart sounds: Normal heart sounds.  ?Pulmonary:  ?   Effort: Pulmonary effort is normal.  ?   Breath sounds: Normal breath sounds.  ?Abdominal:  ?   Palpations: Abdomen is soft.  ?Skin: ?   General: Skin is warm and dry.  ?Neurological:  ?   Mental Status: She is alert.  ?EFM ?baby HR 130bpm with moderate variability, no decels  ? ?MAU Course  ?Reactive NST ?Prolonged fetal monitoring was reassuring. ?Deemed stable for dc ? ? ?Assessment and Plan  ?[redacted] week gestation ?  Reactive NST ? ?Discharge home. ?F/u @ [facility] in 2 weeks ?Return precautions ? ?Armanda Magic MS3 ?01/10/2022, 9:17 AM  ?

## 2022-01-25 ENCOUNTER — Ambulatory Visit (INDEPENDENT_AMBULATORY_CARE_PROVIDER_SITE_OTHER): Payer: Medicaid Other | Admitting: Family Medicine

## 2022-01-25 VITALS — BP 127/84 | HR 88 | Wt 180.0 lb

## 2022-01-25 DIAGNOSIS — Z23 Encounter for immunization: Secondary | ICD-10-CM

## 2022-01-25 DIAGNOSIS — Z3403 Encounter for supervision of normal first pregnancy, third trimester: Secondary | ICD-10-CM

## 2022-01-25 NOTE — Progress Notes (Signed)
?  [place] Prenatal Visit ? ?Sonya Harris is a 22 y.o. G1P0 at 39w2dhere for routine follow up. She is dated by LMP, early ultrasound.  She reports no bleeding, no contractions, no cramping, and no leaking.  She reports fetal movement. She denies vaginal bleeding, contractions, or loss of fluid.  See flow sheet for details. ? ?There were no vitals filed for this visit. ? ? ?A/P: Pregnancy at 331w2d Doing well.   ?Routine prenatal care:  ?Dating reviewed, dating tab is correct ?Fetal heart tones: Appropriate 147 ?Fundal height: within expected range.  33 cm ?The patient does not have a history of HSV and valacyclovir is not indicated at this time.  ?The patient does not have a history of Cesarean delivery and no referral to [facility] indicated ?Infant feeding choice: Both  ?Contraception choice: Pills  ?Infant circumcision desired not applicable ?Influenza vaccine not administered as not influenza season.   ?Tdap was given today. ?COVID vaccination was discussed and declines today.  ?Childbirth and education classes were offered. ?Pregnancy education regarding benefits of breastfeeding, contraception, fetal growth, expected weight gain, and safe infant sleep were discussed.  ?Preterm labor and fetal movement precautions reviewed. ? ? ?2. Pregnancy issues include the following and were addressed as appropriate today: ?Patient was evaluated in the MAU on 01/10/2022 reporting decreased fetal movement.  Monitoring was reassuring and patient denies any decreased fetal movement since that time. ?Patient will continue aspirin for preeclampsia prevention ?Patient has history of seizures as a child, most likely febrile in nature.  She has not had any seizure activity recently.  Continue to monitor. ?Patient has known uterine fibroid (3-4 cm) noted on ultrasound.  Was seen by MFM who scheduled a repeat ultrasound on 02/01/2022 ?Patient received Tdap vaccine at this visit ? ? Problem list  and pregnancy box updated: Yes.  ? ?Patient will need to be scheduled for third trimester faculty OB visit at next visit. ? ?Follow up 2 weeks. ? ?

## 2022-01-25 NOTE — Patient Instructions (Addendum)
It was great seeing you today.  I have no concerns regarding how your baby is doing.  Please make sure you make it to your ultrasound appointment and I have scheduled an appointment for you with me on 02/08/2022 at 10:30 AM.  If you have any vaginal bleeding, gush of fluid, contractions, decreased fetal movement between now and then please be evaluated at the MAU.  I hope you have a wonderful day! ? ? ?

## 2022-02-01 ENCOUNTER — Ambulatory Visit: Payer: Medicaid Other | Attending: Obstetrics

## 2022-02-01 ENCOUNTER — Ambulatory Visit: Payer: Medicaid Other | Admitting: *Deleted

## 2022-02-01 ENCOUNTER — Other Ambulatory Visit: Payer: Self-pay | Admitting: *Deleted

## 2022-02-01 ENCOUNTER — Encounter: Payer: Self-pay | Admitting: *Deleted

## 2022-02-01 VITALS — BP 143/78 | HR 79

## 2022-02-01 DIAGNOSIS — Z3689 Encounter for other specified antenatal screening: Secondary | ICD-10-CM | POA: Insufficient documentation

## 2022-02-01 DIAGNOSIS — Z362 Encounter for other antenatal screening follow-up: Secondary | ICD-10-CM | POA: Diagnosis not present

## 2022-02-01 DIAGNOSIS — Z3A33 33 weeks gestation of pregnancy: Secondary | ICD-10-CM | POA: Diagnosis not present

## 2022-02-01 DIAGNOSIS — D259 Leiomyoma of uterus, unspecified: Secondary | ICD-10-CM

## 2022-02-01 DIAGNOSIS — O3413 Maternal care for benign tumor of corpus uteri, third trimester: Secondary | ICD-10-CM | POA: Diagnosis not present

## 2022-02-01 DIAGNOSIS — O3410 Maternal care for benign tumor of corpus uteri, unspecified trimester: Secondary | ICD-10-CM

## 2022-02-07 NOTE — Progress Notes (Signed)
  [place] Prenatal Visit  Sonya Harris is a 21 y.o. G1P0 at 21w1dhere for routine follow up. She is dated by LMP, early ultrasound.  She reports no bleeding, no contractions, no cramping, and no leaking.  She reports fetal movement. She denies vaginal bleeding, contractions, or loss of fluid.  See flow sheet for details.  Vitals:   02/08/22 1044  BP: 128/77  Pulse: 91     A/P: Pregnancy at 356w1d Doing well.   Routine prenatal care:  Dating reviewed, dating tab is correct Fetal heart tones: Appropriate 147 Fundal height: within expected range. 33.5 cm The patient does not have a history of HSV and valacyclovir is not indicated at this time.  The patient does not have a history of Cesarean delivery and no referral to [facility] indicated Infant feeding choice: Both  Contraception choice: Pills  Infant circumcision desired not applicable Influenza vaccine not administered as not influenza season.   Tdap was given at previous visit. COVID vaccination was discussed and patient continues to decline.  Childbirth and education classes were offered. Pregnancy education regarding benefits of breastfeeding, contraception, fetal growth, expected weight gain, and safe infant sleep were discussed.  Preterm labor and fetal movement precautions reviewed.   2. Pregnancy issues include the following and were addressed as appropriate today: Routine prenatal care Patient continues aspirin for preeclampsia prophylaxis Patient has history of childhood seizures.  Most likely febrile in nature.  No recent seizure activity.  Continue to monitor Patient has known fibroid (3-4 cm) noted on ultrasound.  Repeat ultrasound 02/01/2022 showed appropriate fetal growth and follow-up ultrasound scheduled for 4 weeks after.   Problem list and pregnancy box updated: Yes.   [facility].   Follow up 2 weeks.

## 2022-02-08 ENCOUNTER — Ambulatory Visit (INDEPENDENT_AMBULATORY_CARE_PROVIDER_SITE_OTHER): Payer: Medicaid Other | Admitting: Family Medicine

## 2022-02-08 ENCOUNTER — Other Ambulatory Visit: Payer: Self-pay

## 2022-02-08 DIAGNOSIS — Z3403 Encounter for supervision of normal first pregnancy, third trimester: Secondary | ICD-10-CM

## 2022-02-08 NOTE — Patient Instructions (Signed)
It was great seeing you today!  I am glad everything is going well.  I have no concerns regarding how your baby is doing.  He will need to be seen in 2 weeks and we have scheduled an appointment for 02/21/2022.  If you have any issues between now and then please be evaluated at the maternal assessment unit.  I hope you have a great day!

## 2022-02-21 ENCOUNTER — Encounter (HOSPITAL_COMMUNITY): Payer: Self-pay | Admitting: *Deleted

## 2022-02-21 ENCOUNTER — Other Ambulatory Visit: Payer: Self-pay | Admitting: Certified Nurse Midwife

## 2022-02-21 ENCOUNTER — Telehealth (HOSPITAL_COMMUNITY): Payer: Self-pay | Admitting: *Deleted

## 2022-02-21 ENCOUNTER — Inpatient Hospital Stay (HOSPITAL_BASED_OUTPATIENT_CLINIC_OR_DEPARTMENT_OTHER): Payer: Medicaid Other

## 2022-02-21 ENCOUNTER — Inpatient Hospital Stay (HOSPITAL_COMMUNITY)
Admission: AD | Admit: 2022-02-21 | Discharge: 2022-02-21 | Disposition: A | Discharge status: Home / Self Care | Payer: Medicaid Other | Attending: Obstetrics & Gynecology | Admitting: Obstetrics & Gynecology

## 2022-02-21 ENCOUNTER — Other Ambulatory Visit: Payer: Self-pay

## 2022-02-21 ENCOUNTER — Other Ambulatory Visit (HOSPITAL_COMMUNITY)
Admission: RE | Admit: 2022-02-21 | Discharge: 2022-02-21 | Disposition: A | Discharge status: Home / Self Care | Payer: Medicaid Other | Source: Ambulatory Visit | Attending: Family Medicine | Admitting: Family Medicine

## 2022-02-21 ENCOUNTER — Ambulatory Visit (INDEPENDENT_AMBULATORY_CARE_PROVIDER_SITE_OTHER): Payer: Medicaid Other | Admitting: Family Medicine

## 2022-02-21 ENCOUNTER — Encounter (HOSPITAL_COMMUNITY): Payer: Self-pay | Admitting: Obstetrics & Gynecology

## 2022-02-21 VITALS — BP 157/92 | HR 81 | Wt 192.6 lb

## 2022-02-21 DIAGNOSIS — M7989 Other specified soft tissue disorders: Secondary | ICD-10-CM | POA: Diagnosis not present

## 2022-02-21 DIAGNOSIS — Z3403 Encounter for supervision of normal first pregnancy, third trimester: Secondary | ICD-10-CM | POA: Insufficient documentation

## 2022-02-21 DIAGNOSIS — O133 Gestational [pregnancy-induced] hypertension without significant proteinuria, third trimester: Secondary | ICD-10-CM | POA: Diagnosis not present

## 2022-02-21 DIAGNOSIS — Z3A36 36 weeks gestation of pregnancy: Secondary | ICD-10-CM | POA: Insufficient documentation

## 2022-02-21 DIAGNOSIS — O26893 Other specified pregnancy related conditions, third trimester: Secondary | ICD-10-CM | POA: Insufficient documentation

## 2022-02-21 DIAGNOSIS — R519 Headache, unspecified: Secondary | ICD-10-CM | POA: Diagnosis not present

## 2022-02-21 DIAGNOSIS — Z8669 Personal history of other diseases of the nervous system and sense organs: Secondary | ICD-10-CM | POA: Insufficient documentation

## 2022-02-21 DIAGNOSIS — O139 Gestational [pregnancy-induced] hypertension without significant proteinuria, unspecified trimester: Secondary | ICD-10-CM

## 2022-02-21 DIAGNOSIS — O3413 Maternal care for benign tumor of corpus uteri, third trimester: Secondary | ICD-10-CM | POA: Diagnosis not present

## 2022-02-21 DIAGNOSIS — D259 Leiomyoma of uterus, unspecified: Secondary | ICD-10-CM

## 2022-02-21 DIAGNOSIS — Z3689 Encounter for other specified antenatal screening: Secondary | ICD-10-CM

## 2022-02-21 LAB — COMPREHENSIVE METABOLIC PANEL
ALT: 16 U/L (ref 0–44)
AST: 18 U/L (ref 15–41)
Albumin: 2.9 g/dL — ABNORMAL LOW (ref 3.5–5.0)
Alkaline Phosphatase: 131 U/L — ABNORMAL HIGH (ref 38–126)
Anion gap: 8 (ref 5–15)
BUN: 5 mg/dL — ABNORMAL LOW (ref 6–20)
CO2: 21 mmol/L — ABNORMAL LOW (ref 22–32)
Calcium: 9 mg/dL (ref 8.9–10.3)
Chloride: 107 mmol/L (ref 98–111)
Creatinine, Ser: 0.59 mg/dL (ref 0.44–1.00)
GFR, Estimated: >60 mL/min (ref >60)
Glucose, Bld: 124 mg/dL — ABNORMAL HIGH (ref 70–99)
Potassium: 3.8 mmol/L (ref 3.5–5.1)
Sodium: 136 mmol/L (ref 135–145)
Total Bilirubin: 0.3 mg/dL (ref 0.3–1.2)
Total Protein: 6.2 g/dL — ABNORMAL LOW (ref 6.5–8.1)

## 2022-02-21 LAB — CBC
HCT: 36.9 % (ref 36.0–46.0)
Hemoglobin: 12.5 g/dL (ref 12.0–15.0)
MCH: 27.8 pg (ref 26.0–34.0)
MCHC: 33.9 g/dL (ref 30.0–36.0)
MCV: 82 fL (ref 80.0–100.0)
Platelets: 175 10*3/uL (ref 150–400)
RBC: 4.5 MIL/uL (ref 3.87–5.11)
RDW: 13.2 % (ref 11.5–15.5)
WBC: 8.4 10*3/uL (ref 4.0–10.5)
nRBC: 0 % (ref 0.0–0.2)

## 2022-02-21 LAB — PROTEIN / CREATININE RATIO, URINE
Creatinine, Urine: 137.18 mg/dL
Protein Creatinine Ratio: 0.17 mg/mg{Cre} — ABNORMAL HIGH (ref 0.00–0.15)
Total Protein, Urine: 23 mg/dL

## 2022-02-21 LAB — URINALYSIS, ROUTINE W REFLEX MICROSCOPIC
Bilirubin Urine: NEGATIVE
Glucose, UA: NEGATIVE mg/dL
Hgb urine dipstick: NEGATIVE
Ketones, ur: NEGATIVE mg/dL
Nitrite: NEGATIVE
Protein, ur: NEGATIVE mg/dL
Specific Gravity, Urine: 1.013 (ref 1.005–1.030)
pH: 7 (ref 5.0–8.0)

## 2022-02-21 NOTE — Telephone Encounter (Signed)
Preadmission screen  

## 2022-02-21 NOTE — Progress Notes (Signed)
Repeat BP :147/101

## 2022-02-21 NOTE — MAU Note (Signed)
.  Sonya Harris is a 21 y.o. at 22w1dhere in MAU reporting: She was at her doctor office today and had 2 elevated blood pressures. Pt reports that she was told to come to MAU for further evaluation.   Onset of complaint: today Pain score: denies There were no vitals filed for this visit.   Lab orders placed from triage:   UA, CBC, CMP, PCR urine.

## 2022-02-21 NOTE — Progress Notes (Signed)
Orders placed for IOL on 02/28/22

## 2022-02-21 NOTE — MAU Provider Note (Signed)
Chief Complaint:  Hypertension   Event Date/Time   First Provider Initiated Contact with Patient 02/21/22 1326     HPI: Sonya Harris is a 21 y.o. G1P0 at 35w1dwho presents to maternity admissions [facility]. She met criteria for gestational hypertension in the office today and reported occasional intermittent headaches that resolve without intervention. Denies visual disturbances or dizziness as well as epigastric pain. Has been feeling a little rundown lately and is noticing increased swelling in her lower extremities, hands and face.   P[facility] has been largely uncomplicated. Has a history of seizures, not on meds.   Past Medical History:  Diagnosis Date   [place]    OB History  Gravida Para Term Preterm AB Living  1            SAB IAB Ectopic Multiple Live Births               # Outcome Date GA Lbr Len/2nd Weight Sex Delivery Anes PTL Lv  1 Current            Past Surgical History:  Procedure Laterality Date   SP ARTHRO THUMB*R*     Family History  Problem Relation Age of Onset   Hypertension Mother    Hypertension Maternal Aunt    Social History   Tobacco Use   Smoking status: Never    Passive exposure: Yes   Smokeless tobacco: Never  Vaping Use   Vaping Use: Never used  Substance Use Topics   Alcohol use: Not Currently    Comment: not while preg   Drug use: Never   No Known Allergies No medications prior to admission.   I have reviewed patient's Past Medical Hx, Surgical Hx, Family Hx, Social Hx, medications and allergies.   ROS:  Pertinent items noted in HPI and remainder of comprehensive ROS otherwise negative.   Physical Exam  Patient Vitals for the past 24 hrs:  BP Temp Pulse Resp SpO2  02/21/22 1516 (!) 154/101 -- 90 -- --  02/21/22 1501 (!) 154/92 -- 88 -- --  02/21/22 1459 (!) 157/93 -- 91 -- --  02/21/22 1401 134/78 -- 99 -- --   02/21/22 1346 (!) 143/83 -- 94 -- --  02/21/22 1331 (!) 149/82 -- 98 -- --  02/21/22 1318 (!) 146/81 -- (!) 107 -- --  02/21/22 1316 -- 98.5 F (36.9 C) -- 16 97 %    Constitutional: Well-developed, well-nourished female in no acute distress.  Cardiovascular: normal rate & rhythm Respiratory: normal effort GI: Abd soft, non-tender, gravid appropriate for gestational age MS: Extremities nontender, no edema, normal ROM Neurologic: Alert and oriented x 4.  GU: no CVA tenderness Pelvic: deferred  Fetal Tracing: reactive Baseline: 145 Variability: moderate Accelerations: 15x15 Decelerations: none Toco: occasional, not felt by pt   Labs: Results for orders placed or performed during the hospital encounter of 02/21/22 (from the past 24 hour(s))  Protein / creatinine ratio, urine     Status: Abnormal   Collection Time: 02/21/22 12:52 PM  Result Value Ref Range   Creatinine, Urine 137.18 mg/dL   Total Protein, Urine 23 mg/dL   Protein Creatinine Ratio 0.17 (H) 0.00 - 0.15 mg/mg[Cre]  Urinalysis, Routine w reflex microscopic     Status: Abnormal   Collection Time: 02/21/22 12:52 PM  Result Value Ref Range   Color, Urine YELLOW YELLOW   APPearance HAZY (A) CLEAR  Specific Gravity, Urine 1.013 1.005 - 1.030   pH 7.0 5.0 - 8.0   Glucose, UA NEGATIVE NEGATIVE mg/dL   Hgb urine dipstick NEGATIVE NEGATIVE   Bilirubin Urine NEGATIVE NEGATIVE   Ketones, ur NEGATIVE NEGATIVE mg/dL   Protein, ur NEGATIVE NEGATIVE mg/dL   Nitrite NEGATIVE NEGATIVE   Leukocytes,Ua SMALL (A) NEGATIVE   RBC / HPF 0-5 0 - 5 RBC/hpf   WBC, UA 0-5 0 - 5 WBC/hpf   Bacteria, UA RARE (A) NONE SEEN   Squamous Epithelial / LPF 21-50 0 - 5   Mucus PRESENT   CBC     Status: None   Collection Time: 02/21/22  1:13 PM  Result Value Ref Range   WBC 8.4 4.0 - 10.5 K/uL   RBC 4.50 3.87 - 5.11 MIL/uL   Hemoglobin 12.5 12.0 - 15.0 g/dL   HCT 36.9 36.0 - 46.0 %   MCV 82.0 80.0 - 100.0 fL   MCH 27.8 26.0 - 34.0 pg    MCHC 33.9 30.0 - 36.0 g/dL   RDW 13.2 11.5 - 15.5 %   Platelets 175 150 - 400 K/uL   nRBC 0.0 0.0 - 0.2 %  Comprehensive metabolic panel     Status: Abnormal   Collection Time: 02/21/22  1:13 PM  Result Value Ref Range   Sodium 136 135 - 145 mmol/L   Potassium 3.8 3.5 - 5.1 mmol/L   Chloride 107 98 - 111 mmol/L   CO2 21 (L) 22 - 32 mmol/L   Glucose, Bld 124 (H) 70 - 99 mg/dL   BUN 5 (L) 6 - 20 mg/dL   Creatinine, Ser 0.59 0.44 - 1.00 mg/dL   Calcium 9.0 8.9 - 10.3 mg/dL   Total Protein 6.2 (L) 6.5 - 8.1 g/dL   Albumin 2.9 (L) 3.5 - 5.0 g/dL   AST 18 15 - 41 U/L   ALT 16 0 - 44 U/L   Alkaline Phosphatase 131 (H) 38 - 126 U/L   Total Bilirubin 0.3 0.3 - 1.2 mg/dL   GFR, Estimated >60 >60 mL/min   Anion gap 8 5 - 15   Imaging:  BPP: 8/10 (breathing)  MAU Course: Orders Placed This Encounter  Procedures   [place] MFM FETAL BPP WO NON STRESS   CBC   Comprehensive metabolic panel   Protein / creatinine ratio, urine   Urinalysis, Routine w reflex microscopic Urine, Clean Catch   Discharge patient   No orders of the defined types were placed in this encounter.  MDM: [place] called in report to Dr. Dione Plover who ordered [facility] labs, drawn as soon as pt arrived to MAU. Ordered BPP and sent pt to ultrasound. Reviewed possible outcomes based on how her bloodwork results.  Reviewed results with Dr. Dione Plover who agreed pt is stable to discharge home with IOL scheduled for 37wks. Checked IOL schedule and added her to 02/28/22 at [redacted]w[redacted]d IOL orders placed and plan discussed with patient. Reviewed return precautions and encouraged her to increase her protein/water intake to help prevent further progression into preeclampsia.   Assessment: 1. Gestational hypertension, third trimester   2. NST (non-stress test) reactive   3. [redacted] weeks gestation of pregnancy    Plan: Discharge home in stable condition with preeclampsia precautions.     Follow-up Information     [facility] Follow up  in 1 week(s).   Specialty: Obstetrics and Gynecology Why: L&D will call you when they have a room ready Contact information: [address] [id number] [place] [phone]                Allergies as of 02/21/2022   No Known Allergies      Medication List     TAKE these medications    aspirin EC 81 MG tablet Take 1 tablet (81 mg total) by mouth daily. Swallow whole.   prenatal multivitamin Tabs tablet Take 1 tablet by mouth daily at 12 noon.       Gaylan Gerold, CNM, MSN, [place] Certified Nurse Midwife, [facility]

## 2022-02-21 NOTE — Progress Notes (Signed)
  [place] Prenatal Visit  Sonya Harris is a 21 y.o. G1P0 at 73w1dhere for routine follow up. She is dated by early ultrasound.  She reports no complaints. She reports fetal movement. She denies vaginal bleeding, contractions, or loss of fluid. See flow sheet for details.  Vitals:   02/21/22 1130  BP: (!) 157/92  Pulse: 81    A/P: Pregnancy at 382w1d Doing well.   Routine prenatal care  Dating reviewed, dating tab is correct Fetal heart tones Appropriate 150's Fundal height within expected range.  GBS collected today. .  Repeat GC/CT collected today.  The patient does not have a history of HSV and valacyclovir is not indicated at this time.  Delivery Plan: SVD with epidural Infant feeding choice: Both  Contraception choice: Pills  Infant circumcision desired no Influenza vaccine not administered as not influenza season.   Tdap previously administered between 27-36 weeks  COVID vaccination was discussed and declined.  Pregnancy education regarding preterm labor, fetal movement,  benefits of breastfeeding, contraception, fetal growth, expected weight gain, and safe infant sleep were discussed.    2. Pregnancy issues include the following and were addressed as appropriate today:  GHTN Patient BP elevated to 157/92 and then 1407/101 on repeat BP. Patient reporting symptoms of hand/foot, ankle, swelling, but no other severe symptoms. MAU was contacted and patient was sent to MAU  Fetal head positioning Not performed today. Perform USKoreat next OB visit  GC/Chlamydia/GBS testing Performed swabs today in clinic.   Birthing Classes Patient reports she wants to get involved with birth classes, if not too late.  Do to second elevated BP, she meets criteria for GHTN, and induction should be scheduled for 37 wks. Dr. PrThompson Grayerill follow up on this, if she is d/c from MAU today.

## 2022-02-21 NOTE — Patient Instructions (Addendum)
Please make a follow up appointment in 1 weeks.  Pregnancy Related Return Precautions The follow are signs/symptoms that are abnormal in pregnancy and may require further evaluation by a physician: Go to the MAU at [place] [facility] if: You have cramping/contractions that do not go away with drinking water, especially if they are lasting 30 seconds to 1.5 minutes, coming and going every 5-10 minutes for an hour or more, or are getting stronger and you cannot walk or talk while having a contraction/cramp. Your water breaks.  Sometimes it is a big gush of fluid, sometimes it is just a trickle that keeps getting your underwear wet or running down your legs You have vaginal bleeding.    You do not feel your baby moving like normal.  If you do not, get something to eat and drink (something cold or something with sugar like peanut butter or juice) and lay down and focus on feeling your baby move. If your baby is still not moving like normal, you should go to MAU. You should feel your baby move 6 times in one hour, or 10 times in two hours. You have a persistent headache that does not go away with 1 g of Tylenol, vision changes, chest pain, difficulty breathing, severe pain in your right upper abdomen, worsening leg swelling- these can all be signs of high blood pressure in pregnancy and need to be evaluated by a provider immediately  These are all concerning in pregnancy and if you have any of these I recommend you call your PCP and present to the Maternity Admissions Unit (map below) for further evaluation.  We are sending you to the Maternal Assessment Unit (MAU), to get further workup/labwork done, because of your elevated blood pressure.    Our clinic number is [phone].   Dr Thompson Grayer

## 2022-02-22 ENCOUNTER — Other Ambulatory Visit: Payer: Self-pay | Admitting: Advanced Practice Midwife

## 2022-02-22 LAB — CERVICOVAGINAL ANCILLARY ONLY
Chlamydia: NEGATIVE
Comment: NEGATIVE
Comment: NORMAL
Neisseria Gonorrhea: NEGATIVE

## 2022-02-24 ENCOUNTER — Telehealth: Payer: Self-pay | Admitting: Family Medicine

## 2022-02-24 LAB — CULTURE, BETA STREP (GROUP B ONLY): Strep Gp B Culture: POSITIVE — AB

## 2022-02-24 NOTE — Telephone Encounter (Signed)
**  After Hours/ Emergency Line Call**  Received after-hours emergency line call from [facility] regarding her GBS results.  Patient reports she saw in MyChart that she was GBS positive.  She is concerned because she is scheduled for induction on 6/8 and is worried she does not have enough time to complete a course of antibiotics.  I explained that she will receive IV antibiotics during her induction, which will be sufficient to cover her GBS.  Informed there is no need for multiple day course of antibiotics.  She was very appreciative and relieved to hear this information. No further questions.  Alcus Dad, MD PGY-2, [facility] 02/24/2022 3:52 PM

## 2022-02-25 ENCOUNTER — Inpatient Hospital Stay (HOSPITAL_COMMUNITY)
Admission: AD | Admit: 2022-02-25 | Discharge: 2022-02-25 | Disposition: A | Discharge status: Home / Self Care | Payer: Medicaid Other | Attending: Obstetrics and Gynecology | Admitting: Obstetrics and Gynecology

## 2022-02-25 ENCOUNTER — Encounter (HOSPITAL_COMMUNITY): Payer: Self-pay | Admitting: Obstetrics and Gynecology

## 2022-02-25 ENCOUNTER — Other Ambulatory Visit: Payer: Self-pay

## 2022-02-25 ENCOUNTER — Inpatient Hospital Stay (HOSPITAL_BASED_OUTPATIENT_CLINIC_OR_DEPARTMENT_OTHER): Payer: Medicaid Other

## 2022-02-25 DIAGNOSIS — O368130 Decreased fetal movements, third trimester, not applicable or unspecified: Secondary | ICD-10-CM

## 2022-02-25 DIAGNOSIS — Z3A36 36 weeks gestation of pregnancy: Secondary | ICD-10-CM | POA: Insufficient documentation

## 2022-02-25 DIAGNOSIS — O133 Gestational [pregnancy-induced] hypertension without significant proteinuria, third trimester: Secondary | ICD-10-CM | POA: Insufficient documentation

## 2022-02-25 DIAGNOSIS — Z711 Person with feared health complaint in whom no diagnosis is made: Secondary | ICD-10-CM | POA: Diagnosis not present

## 2022-02-25 DIAGNOSIS — O3413 Maternal care for benign tumor of corpus uteri, third trimester: Secondary | ICD-10-CM | POA: Diagnosis not present

## 2022-02-25 DIAGNOSIS — O1213 Gestational proteinuria, third trimester: Secondary | ICD-10-CM | POA: Insufficient documentation

## 2022-02-25 NOTE — MAU Note (Signed)
...  Sonya Harris is a 21 y.o. at 69w5dhere in MAU reporting: DFM since 0900 this morning. She reports she is feeling movements but reports they are softer than normal. Denies pain. Denies VB or LOF.   Initial FHT 145.

## 2022-02-25 NOTE — MAU Provider Note (Cosign Needed Addendum)
History     CSN: [id number]  Arrival date and time: 02/25/22 1804   Event Date/Time   First Provider Initiated Contact with Patient 02/25/22 1838      Chief Complaint  Patient presents with   Decreased Fetal Movement   Sonya Harris is a 21 y.o. G1P0 at 27w5dwith a past medical history of gestational hypertension who presents to MAU for decreased fetal movement since this morning. She denies any change in activity or potential inciting events. She denies vaginal bleeding, leakage of fluid, contractions, headache, dizziness, and vision changes.   OB History     Gravida  1   Para      Term      Preterm      AB      Living         SAB      IAB      Ectopic      Multiple      Live Births              Past Medical History:  Diagnosis Date   Pregnancy induced hypertension    [place]iz[place]    21 yr old    Past Surgical History:  Procedure Laterality Date   SP ARTHRO THUMB*R*      Family History  Problem Relation Age of Onset   Hypertension Mother    Hypertension Maternal Aunt     Social History   Tobacco Use   Smoking status: Never    Passive exposure: Yes   Smokeless tobacco: Never  Vaping Use   Vaping Use: Never used  Substance Use Topics   Alcohol use: Not Currently    Comment: not while preg   Drug use: Never    Allergies: No Known Allergies  Medications Prior to Admission  Medication Sig Dispense Refill Last Dose   aspirin 81 MG EC tablet Take 1 tablet (81 mg total) by mouth daily. Swallow whole. 30 tablet 11 02/24/2022   Prenatal Vit-Fe Fumarate-FA (PRENATAL MULTIVITAMIN) TABS tablet Take 1 tablet by mouth daily at 12 noon. 100 tablet 2 02/24/2022    Review of Systems  Constitutional:  Negative for activity change.  Eyes:  Negative for visual disturbance.  Genitourinary:  Negative for vaginal bleeding and vaginal discharge.  Neurological:  Negative for dizziness and headaches.  Physical Exam   Blood pressure (!) 146/83,  pulse 79, temperature 98.2 F (36.8 C), temperature source Oral, resp. rate 18, last menstrual period 06/08/2021, SpO2 100 %.  Physical Exam HENT:     Head: Normocephalic and atraumatic.  Eyes:     Conjunctiva/sclera: Conjunctivae normal.  Cardiovascular:     Pulses: Normal pulses.  Pulmonary:     Effort: Pulmonary effort is normal.  Skin:    General: Skin is warm and dry.  Neurological:     Mental Status: She is alert and oriented x3  Abdomen: soft, small fetal kicks noted on exam.   FHR: baseline 140bpm Accelerations: present, 15x15 Decelerations: none Variability: moderate  MAU Course  Procedures  MDM NST reactive BPP  Assessment and Plan  Decreased Fetal Movement - NST reactive - Reassuring fetal heart monitoring  Pregnancy - IOL 02/28/22 due to gFountain6/01/2022, 6:43 PM   [facility] @ [phone]   CNM attestation:  Attestation of Supervision of Student:  I confirm that I have verified the information documented in the medical student's note and that I have  also personally performed the history, physical exam and all medical decision making activities.  I have verified that all services and findings are accurately documented in this student's note; and I agree with management and plan as outlined in the documentation. I have also made any necessary editorial changes.  MAU Course   Patient feeling fetal movement during MAU stay as documented on FHT. Strip reactive and reassuring.  BPP 8/8 in MAU.  Previous diagnosis of gestational hypertension noted. BPs stable in MAU.   Assessment and Plan 1. Physically well but worried   2. Decreased fetal movements in third trimester, single or unspecified fetus   3. [redacted] weeks gestation of pregnancy   - Reassured findings discussed with patient.  - Fetal Kick counts discussed with patient. Information provided in discharge instructions.  - Strict DFM return precautions reviewed.   - Labor precautions discussed.  - Plan for patient to return on 02/28/22 for IOL at 37 weeks for gHTN.  - Patient discharged home in stable condition.  - Patient may return to MAU as needed.     Gailen Shelter, MSN, [place] [facility] [employer], [facility] 02/25/22 9:36 PM

## 2022-02-25 NOTE — MAU Note (Signed)
BPP 8/8 with reactive NST. Discharged to home

## 2022-02-26 ENCOUNTER — Encounter: Payer: Self-pay | Admitting: *Deleted

## 2022-02-28 ENCOUNTER — Inpatient Hospital Stay (HOSPITAL_COMMUNITY): Payer: Medicaid Other

## 2022-02-28 ENCOUNTER — Other Ambulatory Visit: Payer: Self-pay

## 2022-02-28 ENCOUNTER — Encounter (HOSPITAL_COMMUNITY): Payer: Self-pay | Admitting: Family Medicine

## 2022-02-28 ENCOUNTER — Inpatient Hospital Stay (HOSPITAL_COMMUNITY)
Admission: AD | Admit: 2022-02-28 | Discharge: 2022-03-03 | DRG: 806 | Disposition: A | Discharge status: Home / Self Care | Payer: Medicaid Other | Attending: Obstetrics and Gynecology | Admitting: Obstetrics and Gynecology

## 2022-02-28 DIAGNOSIS — O9912 Other diseases of the blood and blood-forming organs and certain disorders involving the immune mechanism complicating childbirth: Secondary | ICD-10-CM | POA: Diagnosis not present

## 2022-02-28 DIAGNOSIS — D6959 Other secondary thrombocytopenia: Secondary | ICD-10-CM | POA: Diagnosis not present

## 2022-02-28 DIAGNOSIS — O134 Gestational [pregnancy-induced] hypertension without significant proteinuria, complicating childbirth: Principal | ICD-10-CM | POA: Diagnosis present

## 2022-02-28 DIAGNOSIS — O133 Gestational [pregnancy-induced] hypertension without significant proteinuria, third trimester: Secondary | ICD-10-CM

## 2022-02-28 DIAGNOSIS — O368130 Decreased fetal movements, third trimester, not applicable or unspecified: Secondary | ICD-10-CM | POA: Diagnosis present

## 2022-02-28 DIAGNOSIS — Z3A37 37 weeks gestation of pregnancy: Secondary | ICD-10-CM | POA: Diagnosis not present

## 2022-02-28 DIAGNOSIS — O139 Gestational [pregnancy-induced] hypertension without significant proteinuria, unspecified trimester: Principal | ICD-10-CM | POA: Diagnosis present

## 2022-02-28 DIAGNOSIS — Z7982 Long term (current) use of aspirin: Secondary | ICD-10-CM

## 2022-02-28 DIAGNOSIS — D696 Thrombocytopenia, unspecified: Secondary | ICD-10-CM | POA: Diagnosis not present

## 2022-02-28 DIAGNOSIS — O99824 Streptococcus B carrier state complicating childbirth: Secondary | ICD-10-CM | POA: Diagnosis not present

## 2022-02-28 LAB — COMPREHENSIVE METABOLIC PANEL
ALT: 15 U/L (ref 0–44)
AST: 19 U/L (ref 15–41)
Albumin: 2.9 g/dL — ABNORMAL LOW (ref 3.5–5.0)
Alkaline Phosphatase: 138 U/L — ABNORMAL HIGH (ref 38–126)
Anion gap: 7 (ref 5–15)
BUN: <5 mg/dL — ABNORMAL LOW (ref 6–20)
CO2: 22 mmol/L (ref 22–32)
Calcium: 9 mg/dL (ref 8.9–10.3)
Chloride: 108 mmol/L (ref 98–111)
Creatinine, Ser: 0.51 mg/dL (ref 0.44–1.00)
GFR, Estimated: >60 mL/min (ref >60)
Glucose, Bld: 86 mg/dL (ref 70–99)
Potassium: 3.8 mmol/L (ref 3.5–5.1)
Sodium: 137 mmol/L (ref 135–145)
Total Bilirubin: 0.6 mg/dL (ref 0.3–1.2)
Total Protein: 6.1 g/dL — ABNORMAL LOW (ref 6.5–8.1)

## 2022-02-28 LAB — CBC
HCT: 36.6 % (ref 36.0–46.0)
HCT: 35.6 % — ABNORMAL LOW (ref 36.0–46.0)
Hemoglobin: 11.9 g/dL — ABNORMAL LOW (ref 12.0–15.0)
Hemoglobin: 11.9 g/dL — ABNORMAL LOW (ref 12.0–15.0)
MCH: 27.2 pg (ref 26.0–34.0)
MCH: 27.7 pg (ref 26.0–34.0)
MCHC: 32.5 g/dL (ref 30.0–36.0)
MCHC: 33.4 g/dL (ref 30.0–36.0)
MCV: 83.6 fL (ref 80.0–100.0)
MCV: 83 fL (ref 80.0–100.0)
Platelets: 198 10*3/uL (ref 150–400)
Platelets: 185 10*3/uL (ref 150–400)
RBC: 4.38 MIL/uL (ref 3.87–5.11)
RBC: 4.29 MIL/uL (ref 3.87–5.11)
RDW: 13.2 % (ref 11.5–15.5)
RDW: 13.3 % (ref 11.5–15.5)
WBC: 8.4 10*3/uL (ref 4.0–10.5)
WBC: 11 10*3/uL — ABNORMAL HIGH (ref 4.0–10.5)
nRBC: 0 % (ref 0.0–0.2)
nRBC: 0 % (ref 0.0–0.2)

## 2022-02-28 LAB — RPR: RPR Ser Ql: NONREACTIVE

## 2022-02-28 LAB — PROTEIN / CREATININE RATIO, URINE
Creatinine, Urine: 233.45 mg/dL
Protein Creatinine Ratio: 0.14 mg/mg{Cre} (ref 0.00–0.15)
Total Protein, Urine: 33 mg/dL

## 2022-02-28 MED ORDER — DIPHENHYDRAMINE HCL 50 MG/ML IJ SOLN: 12.5000 mg | INTRAMUSCULAR | Status: DC | PRN

## 2022-02-28 MED ORDER — LACTATED RINGERS IV SOLN: 500.0000 mL | INTRAVENOUS | Status: DC | PRN

## 2022-02-28 MED ORDER — FENTANYL CITRATE (PF) 100 MCG/2ML IJ SOLN: 100.0000 ug | INTRAMUSCULAR | Status: DC | PRN

## 2022-02-28 MED ORDER — OXYTOCIN BOLUS FROM INFUSION
333.0000 mL | Freq: Once | INTRAVENOUS | Status: AC
  Administered 2022-03-01: 333 mL via INTRAVENOUS

## 2022-02-28 MED ORDER — SODIUM CHLORIDE 0.9 % IV SOLN
5.0000 10*6.[IU] | Freq: Once | INTRAVENOUS | Status: AC
Start: 2022-02-28 — End: 2022-02-28
  Administered 2022-02-28: 5 10*6.[IU] via INTRAVENOUS
  Filled 2022-02-28: qty 5

## 2022-02-28 MED ORDER — FLEET ENEMA 7-19 GM/118ML RE ENEM: 1.0000 | ENEMA | RECTAL | Status: DC | PRN

## 2022-02-28 MED ORDER — EPHEDRINE 5 MG/ML INJ: 10.0000 mg | INTRAVENOUS | Status: DC | PRN

## 2022-02-28 MED ORDER — LIDOCAINE HCL (PF) 1 % IJ SOLN: 30.0000 mL | INTRAMUSCULAR | Status: DC | PRN

## 2022-02-28 MED ORDER — OXYTOCIN-SODIUM CHLORIDE 30-0.9 UT/500ML-% IV SOLN
1.0000 m[IU]/min | INTRAVENOUS | Status: DC
  Administered 2022-02-28: 2 m[IU]/min via INTRAVENOUS

## 2022-02-28 MED ORDER — PENICILLIN G POTASSIUM 5000000 UNITS IJ SOLR
3.0000 10*6.[IU] | INTRAMUSCULAR | Status: DC
Start: 2022-02-28 — End: 2022-02-28

## 2022-02-28 MED ORDER — ACETAMINOPHEN 325 MG PO TABS: 650.0000 mg | ORAL_TABLET | ORAL | Status: DC | PRN

## 2022-02-28 MED ORDER — PHENYLEPHRINE 80 MCG/ML (10ML) SYRINGE FOR IV PUSH (FOR BLOOD PRESSURE SUPPORT): 80.0000 ug | PREFILLED_SYRINGE | INTRAVENOUS | Status: DC | PRN

## 2022-02-28 MED ORDER — FENTANYL-BUPIVACAINE-NACL 0.5-0.125-0.9 MG/250ML-% EP SOLN
12.0000 mL/h | EPIDURAL | Status: DC | PRN
  Administered 2022-03-01: 12 mL/h via EPIDURAL
  Filled 2022-02-28: qty 250

## 2022-02-28 MED ORDER — PENICILLIN G POT IN DEXTROSE 60000 UNIT/ML IV SOLN
3.0000 10*6.[IU] | INTRAVENOUS | Status: DC
  Administered 2022-02-28 – 2022-03-01 (×4): 3 10*6.[IU] via INTRAVENOUS
  Filled 2022-02-28 (×4): qty 50

## 2022-02-28 MED ORDER — ONDANSETRON HCL 4 MG/2ML IJ SOLN: 4.0000 mg | Freq: Four times a day (QID) | INTRAMUSCULAR | Status: DC | PRN

## 2022-02-28 MED ORDER — OXYCODONE-ACETAMINOPHEN 5-325 MG PO TABS: 2.0000 | ORAL_TABLET | ORAL | Status: DC | PRN

## 2022-02-28 MED ORDER — OXYTOCIN-SODIUM CHLORIDE 30-0.9 UT/500ML-% IV SOLN
2.5000 [IU]/h | INTRAVENOUS | Status: DC
  Filled 2022-02-28: qty 500

## 2022-02-28 MED ORDER — TERBUTALINE SULFATE 1 MG/ML IJ SOLN: 0.2500 mg | Freq: Once | INTRAMUSCULAR | Status: DC | PRN

## 2022-02-28 MED ORDER — OXYCODONE-ACETAMINOPHEN 5-325 MG PO TABS: 1.0000 | ORAL_TABLET | ORAL | Status: DC | PRN

## 2022-02-28 MED ORDER — PENICILLIN G POTASSIUM 5000000 UNITS IJ SOLR
5.0000 10*6.[IU] | INTRAMUSCULAR | Status: DC
Start: 2022-02-28 — End: 2022-02-28

## 2022-02-28 MED ORDER — PHENYLEPHRINE 80 MCG/ML (10ML) SYRINGE FOR IV PUSH (FOR BLOOD PRESSURE SUPPORT)
80.0000 ug | PREFILLED_SYRINGE | INTRAVENOUS | Status: DC | PRN
  Filled 2022-02-28: qty 10

## 2022-02-28 MED ORDER — LACTATED RINGERS IV SOLN
500.0000 mL | Freq: Once | INTRAVENOUS | Status: AC
  Administered 2022-03-01: 500 mL via INTRAVENOUS

## 2022-02-28 MED ORDER — MISOPROSTOL 50MCG HALF TABLET
50.0000 ug | ORAL_TABLET | ORAL | Status: DC | PRN
  Administered 2022-02-28 (×2): 50 ug via BUCCAL
  Filled 2022-02-28: qty 1

## 2022-02-28 MED ORDER — MISOPROSTOL 50MCG HALF TABLET
50.0000 ug | ORAL_TABLET | ORAL | Status: DC | PRN
  Filled 2022-02-28: qty 1

## 2022-02-28 MED ORDER — LACTATED RINGERS IV SOLN: INTRAVENOUS | Status: DC

## 2022-02-28 MED ORDER — SOD CITRATE-CITRIC ACID 500-334 MG/5ML PO SOLN: 30.0000 mL | ORAL | Status: DC | PRN

## 2022-02-28 MED ORDER — PENICILLIN G POTASSIUM 5000000 UNITS IJ SOLR: 5.0000 10*6.[IU] | INTRAMUSCULAR | Status: DC

## 2022-02-28 NOTE — Progress Notes (Signed)
LABOR PROGRESS NOTE  Sonya Harris is a 21 y.o. G1P0 at [redacted]w[redacted]d admitted for IOL for gHTN   Subjective: Doing well, feeling more contractions.   Objective: BP (!) 161/99   Pulse 88   Temp 98.2 F (36.8 C) (Oral)   Resp 18   Ht '5\' 5"'$  (1.651 m)   Wt 88.1 kg   LMP 06/08/2021 (Approximate)   SpO2 100%   BMI 32.33 kg/m  or  Vitals:   02/28/22 1405 02/28/22 1516 02/28/22 1608 02/28/22 1709  BP: 140/84 (!) 143/77 (!) 149/78 (!) 161/99  Pulse: 83 82 82 88  Resp: '18 16 16 18  '$ Temp:  98.2 F (36.8 C)    TempSrc:  Oral    SpO2:      Weight:      Height:       Dilation: 3 Effacement (%): 50 Station: -2 Presentation: Vertex Exam by:: VGifford ShaveFHT: baseline rate 145 , moderate varibility, + acel, no decel Toco: 8-10 min  Labs: Lab Results  Component Value Date   WBC 8.4 02/28/2022   HGB 11.9 (L) 02/28/2022   HCT 36.6 02/28/2022   MCV 83.6 02/28/2022   PLT 198 02/28/2022    Patient Active Problem List   Diagnosis Date Noted   Gestational hypertension 02/28/2022   Gestational hypertension affecting first pregnancy 02/21/2022   Supervision of normal first pregnancy 09/04/2021   Allergic rhinitis 05/01/2012   ECZEMA, ATOPIC DERMATITIS 11/20/2006    Assessment / Plan: 21y.o. G1P0 at 373w1dere for IOL for gHTN   Labor: External os 3-4 cm, internal os 1-2 cm. Foley baloon placed. Additional dose of cytotec given. Recheck in 5 hours, plan on starting pitocin if foley balloon out at that time.  Fetal Wellbeing:  Cat 1, reassuring  Pain Control:  PRN ep[idural  Anticipated MOD:  Vaginal delivery  ViGifford ShaveMD  PGY-3, [facility]  02/28/2022, 5:13 PM

## 2022-02-28 NOTE — Progress Notes (Signed)
LABOR PROGRESS NOTE  Sonya Harris is a 21 y.o. G1P0 at [redacted]w[redacted]d admitted for IOL for gHTN   Subjective: Feeling uncomfortable at this time. Foley balloon out.   Objective: BP (!) 137/99   Pulse 90   Temp 97.8 F (36.6 C) (Axillary)   Resp 18   Ht '5\' 5"'$  (1.651 m)   Wt 88.1 kg   LMP 06/08/2021 (Approximate)   SpO2 100%   BMI 32.33 kg/m  or  Vitals:   02/28/22 1752 02/28/22 1853 02/28/22 2005 02/28/22 2100  BP: 138/81 (!) 147/86 (!) 145/85 (!) 137/99  Pulse: 91 82 84 90  Resp: '16 16 18 18  '$ Temp:  98.4 F (36.9 C)  97.8 F (36.6 C)  TempSrc:  Oral  Axillary  SpO2:      Weight:      Height:       Dilation: 5.5 Effacement (%): 70 Station: -2 Presentation: Vertex Exam by:: VGifford ShaveFHT: baseline rate 135 , moderate varibility, + acel, no decel Toco: 4-6 min  Labs: Lab Results  Component Value Date   WBC 8.4 02/28/2022   HGB 11.9 (L) 02/28/2022   HCT 36.6 02/28/2022   MCV 83.6 02/28/2022   PLT 198 02/28/2022    Patient Active Problem List   Diagnosis Date Noted   Gestational hypertension 02/28/2022   Gestational hypertension affecting first pregnancy 02/21/2022   Supervision of normal first pregnancy 09/04/2021   Allergic rhinitis 05/01/2012   ECZEMA, ATOPIC DERMATITIS 11/20/2006    Assessment / Plan: 21y.o. G1P0 at 366w1dere for IOL for gHTN   Labor: Foley balloon out at this check. Cervical exam 5/ 70/-2. Contactions every 4-6 min. Will start pitocin 2x2 gHTN: Mild range elevated BP. Reported severe ranges were positional or when patient was having foley balloon placed. Will continue to monitor.  Fetal Wellbeing:  Cat 1, reassuring  Pain Control:  PRN ep[idural  Anticipated MOD:  Vaginal delivery  ViGifford ShaveMD  PGY-3, [facility]  02/28/2022, 10:11 PM

## 2022-02-28 NOTE — Progress Notes (Addendum)
Patient ID: Sonya Harris, female   DOB: 15-Feb-2001, 21 y.o.   MRN: [id number]   Subjective: -Patient resting in bed.  Reports mild contractions, but is coping well.  Endorses fetal movement. FOB-Sonya Harris at bedside.   Objective: BP (!) 143/77   Pulse 82   Temp 98.2 F (36.8 C) (Oral)   Resp 16   Ht '5\' 5"'$  (1.651 m)   Wt 88.1 kg   LMP 06/08/2021 (Approximate)   SpO2 100%   BMI 32.33 kg/m  No intake/output data recorded. No intake/output data recorded.  Fetal Monitoring: FHT: 135 bpm, Mod Var, -Decels, +Accels UC: None palpated.    Vaginal Exam: SVE:   Dilation: 1 Effacement (%): 50 Station: -2 Exam by:: Sonya Harris Membranes: Intact Internal Monitors: None  Augmentation/Induction: Pitocin:None Cytotec: S/P one dose  Assessment:  IUP at 37.1 weeks Cat I FT  IOL for gHTN  Plan: -Discussed placement of foley bulb at next check. Reviewed procedure, benefits, and risks.  -Patient desiring oral contraception for birth control.  Reviewed usage during breastfeeding. -No questions or concerns currently. -Encouraged rest.  -BP overall normotensive and no severe ranges. -Continue other mgmt as ordered  Sonya Harris, CNM Advanced Practice Provider, [facility] [place] 02/28/2022, 3:23 PM

## 2022-02-28 NOTE — Progress Notes (Signed)
LABOR PROGRESS NOTE  Sonya Harris is a 21 y.o. G1P0 at [redacted]w[redacted]d admitted for IOL for gHTN   Subjective: Doing well, no concerns.   Objective: BP 139/87   Pulse 96   Temp 98.3 F (36.8 C) (Oral)   Resp 18   Ht '5\' 5"'$  (1.651 m)   Wt 88.1 kg   LMP 06/08/2021 (Approximate)   SpO2 100%   BMI 32.33 kg/m  or  Vitals:   02/28/22 0907 02/28/22 0947 02/28/22 1121 02/28/22 1226  BP: 136/85 135/87 138/88 139/87  Pulse: 93 79 90 96  Resp: '16 16 18 18  '$ Temp: 98.1 F (36.7 C)   98.3 F (36.8 C)  TempSrc: Oral   Oral  SpO2: 100%     Weight: 88.1 kg     Height: '5\' 5"'$  (1.651 m)      Dilation: 1 Effacement (%): 50 Station: -2 Presentation: Vertex Exam by:: VGifford ShaveFHT: baseline rate 145 , moderate varibility, + acel, no decel Toco: 8-10 min  Labs: Lab Results  Component Value Date   WBC 8.4 02/28/2022   HGB 11.9 (L) 02/28/2022   HCT 36.6 02/28/2022   MCV 83.6 02/28/2022   PLT 198 02/28/2022    Patient Active Problem List   Diagnosis Date Noted   Gestational hypertension 02/28/2022   Gestational hypertension affecting first pregnancy 02/21/2022   Supervision of normal first pregnancy 09/04/2021   Allergic rhinitis 05/01/2012   ECZEMA, ATOPIC DERMATITIS 11/20/2006    Assessment / Plan: 20y.o. G1P0 at 34w1dere for IOL for gHTN   Labor: Starting induction process. Cervical exam 1/50/-2, attempted foley balloon placement but was unsuccessful. Will give buccal cytotec and recheck in 4 hours. Plan for foley balloon placement at this time if appropriate, also consider redosing cytotec vs pitocin Fetal Wellbeing:  Cat 1, reassuring  Pain Control:  PRN ep[idural  Anticipated MOD:  Vaginal delivery  ViGifford ShaveMD  PGY-3, [facility]  02/28/2022, 12:46 PM

## 2022-02-28 NOTE — H&P (Addendum)
OBSTETRIC ADMISSION HISTORY AND PHYSICAL  Sonya Harris is a 21 y.o. female G1P0 with IUP at 14w1dby [place] for IOL for gHTN. She reports +FMs, No LOF, no VB, no blurry vision, headaches or peripheral edema, and RUQ pain.  She plans on breast and formula feeding. She request OCPs for birth control.  [facility]  Dating: By [place]--->  Estimated Date of Delivery: 03/20/22  Sono:    '@[redacted]w[redacted]d'$ , CWD, normal anatomy, cephalic presentation, posterior placental lie, 1933g, 15% EFW  Prenatal History/Complications:  Gestational Hypertension- no medications  Past Medical History: Past Medical History:  Diagnosis Date   Pregnancy induced hypertension    Seizures ([place]    21 yr old   Past Surgical History: Past Surgical History:  Procedure Laterality Date   SP ARTHRO THUMB*R*     Obstetrical History: OB History     Gravida  1   Para      Term      Preterm      AB      Living         SAB      IAB      Ectopic      Multiple      Live Births              Social History Social History   Socioeconomic History   Marital status: Single    Spouse name: Not on file   Number of children: Not on file   Years of education: Not on file   Highest education level: Not on file  Occupational History   Not on file  Tobacco Use   Smoking status: Never    Passive exposure: Yes   Smokeless tobacco: Never  Vaping Use   Vaping Use: Never used  Substance and Sexual Activity   Alcohol use: Not Currently    Comment: not while preg   Drug use: Never   Sexual activity: Yes  Other Topics Concern   Not on file  Social History Narrative   Not on file   Social Determinants of Health   Financial Resource Strain: Not on file  Food Insecurity: Not on file  Transportation Needs: Not on file  Physical Activity: Not on file  Stress: Not on file  Social Connections: Not on file    Family History: Family History  Problem  Relation Age of Onset   Hypertension Mother    Hypertension Maternal Aunt    Allergies: No Known Allergies  Medications Prior to Admission  Medication Sig Dispense Refill Last Dose   aspirin 81 MG EC tablet Take 1 tablet (81 mg total) by mouth daily. Swallow whole. 30 tablet 11 02/27/2022   Prenatal Vit-Fe Fumarate-FA (PRENATAL MULTIVITAMIN) TABS tablet Take 1 tablet by mouth daily at 12 noon. 100 tablet 2 02/27/2022   Review of Systems   All systems reviewed and negative except as stated in HPI  Blood pressure 139/87, pulse 96, temperature 98.3 F (36.8 C), temperature source Oral, resp. rate 18, height '5\' 5"'$  (1.651 m), weight 88.1 kg, last menstrual period 06/08/2021, SpO2 100 %. General appearance: alert and no distress Lungs: no increased work of breathing Heart: normal heart rate Abdomen: soft, non-tender Extremities: no LE edema or calf tenderness Presentation: cephalic Fetal monitoringBaseline: 140s bpm, Variability: Good {> 6 bpm), Accelerations: Reactive, and Decelerations: Absent Uterine activityFrequency: she is not feeling her contractions Dilation: 1 Effacement (%): 50 Station: -2 Exam by:: VChristy Sartorius  Cresenzo  Prenatal labs: ABO, Rh: --/--/B POS (06/08 0941) Antibody: NEG (06/08 0941) Rubella: 2.36 (11/04 1626) RPR: NON REACTIVE (06/08 0941)  HBsAg: Negative (11/04 1626)  HIV: Non Reactive (04/06 1104)  GBS: Positive/-- (06/01 1358)  1 hr Glucola elevated (139), normal 3 hour (97) Genetic screening: low risk Panorama, negative Careers information officer [place] normal  Prenatal Transfer Tool  Maternal Diabetes: No Genetic Screening: Normal Maternal Ultrasounds/Referrals: Normal Fetal Ultrasounds or other Referrals:  Other:  Normal BPP for decreased FM and gHTN Maternal Substance Abuse:  No Significant Maternal Medications:  None Significant Maternal Lab Results: Group B Strep positive  Results for orders placed or performed during the hospital encounter of 02/28/22 (from the  past 24 hour(s))  Protein / creatinine ratio, urine   Collection Time: 02/28/22  9:29 AM  Result Value Ref Range   Creatinine, Urine 233.45 mg/dL   Total Protein, Urine 33 mg/dL   Protein Creatinine Ratio 0.14 0.00 - 0.15 mg/mg[Cre]  CBC   Collection Time: 02/28/22  9:41 AM  Result Value Ref Range   WBC 8.4 4.0 - 10.5 K/uL   RBC 4.38 3.87 - 5.11 MIL/uL   Hemoglobin 11.9 (L) 12.0 - 15.0 g/dL   HCT 36.6 36.0 - 46.0 %   MCV 83.6 80.0 - 100.0 fL   MCH 27.2 26.0 - 34.0 pg   MCHC 32.5 30.0 - 36.0 g/dL   RDW 13.2 11.5 - 15.5 %   Platelets 198 150 - 400 K/uL   nRBC 0.0 0.0 - 0.2 %  RPR   Collection Time: 02/28/22  9:41 AM  Result Value Ref Range   RPR Ser Ql NON REACTIVE NON REACTIVE  Comprehensive metabolic panel   Collection Time: 02/28/22  9:41 AM  Result Value Ref Range   Sodium 137 135 - 145 mmol/L   Potassium 3.8 3.5 - 5.1 mmol/L   Chloride 108 98 - 111 mmol/L   CO2 22 22 - 32 mmol/L   Glucose, Bld 86 70 - 99 mg/dL   BUN <5 (L) 6 - 20 mg/dL   Creatinine, Ser 0.51 0.44 - 1.00 mg/dL   Calcium 9.0 8.9 - 10.3 mg/dL   Total Protein 6.1 (L) 6.5 - 8.1 g/dL   Albumin 2.9 (L) 3.5 - 5.0 g/dL   AST 19 15 - 41 U/L   ALT 15 0 - 44 U/L   Alkaline Phosphatase 138 (H) 38 - 126 U/L   Total Bilirubin 0.6 0.3 - 1.2 mg/dL   GFR, Estimated >60 >60 mL/min   Anion gap 7 5 - 15  Type and screen   Collection Time: 02/28/22  9:41 AM  Result Value Ref Range   ABO/RH(D) B POS    Antibody Screen NEG    Sample Expiration      03/03/2022,2359 Performed at [facility], [address], [place], [place] [address]     Patient Active Problem List   Diagnosis Date Noted   Gestational hypertension 02/28/2022   Gestational hypertension affecting first pregnancy 02/21/2022   Supervision of normal first pregnancy 09/04/2021   Allergic rhinitis 05/01/2012   ECZEMA, ATOPIC DERMATITIS 11/20/2006    Assessment/Plan:  Malayja Freund is a 21 y.o. G1P0 at 34w1dhere for IOL for gestational  hypertension  #Labor: Latent labor. Start Cytotec for cervical ripening. #Pain: PRN, planning for epidural #FWB: Category 1 #ID:  GBS Positive, Start Penicillin #MOF: Both breast and formula #MOC: OCPs #gHTN: Admit UPC 0.14 and Cr. 0.51. Continue to monitor BPs  Leonette Nutting, Medical Student  02/28/2022, 12:46 PM  Attestation of Supervision of Student:  I confirm that I have verified the information documented in the medical student's note and that I have also personally reperformed the history, physical exam and all medical decision making activities.  I have verified that all services and findings are accurately documented in this student's note; and I agree with management and plan as outlined in the documentation. I have also made any necessary editorial changes.  Consider foley bulb with initial exam BP normotensive  Maryann Conners, [place] for [employer], [employer] 02/28/2022

## 2022-02-28 NOTE — Progress Notes (Signed)
Patient ID: Sonya Harris, female   DOB: October 13, 2000, 21 y.o.   MRN: [id number] Doing well Feeling mild cramping  Vitals:   02/28/22 1752 02/28/22 1853 02/28/22 2005 02/28/22 2100  BP: 138/81 (!) 147/86 (!) 145/85 (!) 137/99  Pulse: 91 82 84 90  Resp: '16 16 18 18  '$ Temp:  98.4 F (36.9 C)  97.8 F (36.6 C)  TempSrc:  Oral  Axillary  SpO2:      Weight:      Height:       FHR reactive, category I UCs every 4-65mn  Cervix exam deferred  Will wait for Foley to come out then start Pitocin

## 2022-03-01 ENCOUNTER — Encounter (HOSPITAL_COMMUNITY): Payer: Self-pay | Admitting: Family Medicine

## 2022-03-01 ENCOUNTER — Inpatient Hospital Stay (HOSPITAL_COMMUNITY): Payer: Medicaid Other | Admitting: Anesthesiology

## 2022-03-01 ENCOUNTER — Ambulatory Visit: Payer: Medicaid Other

## 2022-03-01 DIAGNOSIS — E669 Obesity, unspecified: Secondary | ICD-10-CM | POA: Diagnosis not present

## 2022-03-01 DIAGNOSIS — Z3A37 37 weeks gestation of pregnancy: Secondary | ICD-10-CM | POA: Diagnosis not present

## 2022-03-01 DIAGNOSIS — O99214 Obesity complicating childbirth: Secondary | ICD-10-CM | POA: Diagnosis not present

## 2022-03-01 DIAGNOSIS — D649 Anemia, unspecified: Secondary | ICD-10-CM | POA: Diagnosis not present

## 2022-03-01 DIAGNOSIS — O9902 Anemia complicating childbirth: Secondary | ICD-10-CM | POA: Diagnosis not present

## 2022-03-01 DIAGNOSIS — O134 Gestational [pregnancy-induced] hypertension without significant proteinuria, complicating childbirth: Secondary | ICD-10-CM | POA: Diagnosis not present

## 2022-03-01 DIAGNOSIS — O99824 Streptococcus B carrier state complicating childbirth: Secondary | ICD-10-CM | POA: Diagnosis not present

## 2022-03-01 LAB — CBC
HCT: 35.3 % — ABNORMAL LOW (ref 36.0–46.0)
Hemoglobin: 11.4 g/dL — ABNORMAL LOW (ref 12.0–15.0)
MCH: 27 pg (ref 26.0–34.0)
MCHC: 32.3 g/dL (ref 30.0–36.0)
MCV: 83.6 fL (ref 80.0–100.0)
Platelets: 186 10*3/uL (ref 150–400)
RBC: 4.22 MIL/uL (ref 3.87–5.11)
RDW: 13.2 % (ref 11.5–15.5)
WBC: 12.8 10*3/uL — ABNORMAL HIGH (ref 4.0–10.5)
nRBC: 0 % (ref 0.0–0.2)

## 2022-03-01 LAB — DIC (DISSEMINATED INTRAVASCULAR COAGULATION)PANEL
D-Dimer, Quant: 3.08 ug/mL-FEU — ABNORMAL HIGH (ref 0.00–0.50)
Fibrinogen: 439 mg/dL (ref 210–475)
INR: 1.1 (ref 0.8–1.2)
Platelets: 178 10*3/uL (ref 150–400)
Prothrombin Time: 13.7 seconds (ref 11.4–15.2)
Smear Review: NONE SEEN
aPTT: 27 seconds (ref 24–36)

## 2022-03-01 LAB — POSTPARTUM HEMORRHAGE PROTOCOL (BB NOTIFICATION)

## 2022-03-01 LAB — HEMOGLOBIN: Hemoglobin: 8.3 g/dL — ABNORMAL LOW (ref 12.0–15.0)

## 2022-03-01 LAB — ABO/RH: ABO/RH(D): B POS

## 2022-03-01 LAB — PREPARE RBC (CROSSMATCH)

## 2022-03-01 MED ORDER — SODIUM CHLORIDE 0.9% IV SOLUTION: Freq: Once | INTRAVENOUS | Status: DC

## 2022-03-01 MED ORDER — LIDOCAINE-EPINEPHRINE (PF) 1.5 %-1:200000 IJ SOLN
INTRAMUSCULAR | Status: DC | PRN
  Administered 2022-03-01: 5 mL via EPIDURAL

## 2022-03-01 MED ORDER — METHYLERGONOVINE MALEATE 0.2 MG/ML IJ SOLN
INTRAMUSCULAR | Status: AC
  Administered 2022-03-01: 0.2 mg
  Filled 2022-03-01: qty 3

## 2022-03-01 MED ORDER — ONDANSETRON HCL 4 MG/2ML IJ SOLN: 4.0000 mg | INTRAMUSCULAR | Status: DC | PRN

## 2022-03-01 MED ORDER — PRENATAL MULTIVITAMIN CH
1.0000 | ORAL_TABLET | Freq: Every day | ORAL | Status: DC
  Administered 2022-03-02: 1 via ORAL
  Filled 2022-03-01 (×2): qty 1

## 2022-03-01 MED ORDER — COCONUT OIL OIL: 1.0000 "application " | TOPICAL_OIL | Status: DC | PRN

## 2022-03-01 MED ORDER — OXYTOCIN-SODIUM CHLORIDE 30-0.9 UT/500ML-% IV SOLN
INTRAVENOUS | Status: AC
  Administered 2022-03-01: 10 [IU]/h via INTRAVENOUS
  Filled 2022-03-01: qty 500

## 2022-03-01 MED ORDER — OXYTOCIN-SODIUM CHLORIDE 30-0.9 UT/500ML-% IV SOLN: 10.0000 [IU]/h | INTRAVENOUS | Status: DC

## 2022-03-01 MED ORDER — ONDANSETRON HCL 4 MG PO TABS: 4.0000 mg | ORAL_TABLET | ORAL | Status: DC | PRN

## 2022-03-01 MED ORDER — ACETAMINOPHEN 325 MG PO TABS
650.0000 mg | ORAL_TABLET | Freq: Once | ORAL | Status: AC
  Administered 2022-03-01: 650 mg via ORAL
  Filled 2022-03-01: qty 2

## 2022-03-01 MED ORDER — LACTATED RINGERS IV BOLUS
1000.0000 mL | Freq: Once | INTRAVENOUS | Status: AC
  Administered 2022-03-01: 1000 mL via INTRAVENOUS

## 2022-03-01 MED ORDER — LIDOCAINE HCL (PF) 1 % IJ SOLN
INTRAMUSCULAR | Status: DC | PRN
  Administered 2022-03-01: 5 mL via EPIDURAL

## 2022-03-01 MED ORDER — IBUPROFEN 600 MG PO TABS
600.0000 mg | ORAL_TABLET | Freq: Four times a day (QID) | ORAL | Status: DC
  Administered 2022-03-01 – 2022-03-03 (×6): 600 mg via ORAL
  Filled 2022-03-01 (×6): qty 1

## 2022-03-01 MED ORDER — TRANEXAMIC ACID-NACL 1000-0.7 MG/100ML-% IV SOLN: 1000.0000 mg | INTRAVENOUS | Status: AC

## 2022-03-01 MED ORDER — METHYLERGONOVINE MALEATE 0.2 MG/ML IJ SOLN: 0.2000 mg | Freq: Once | INTRAMUSCULAR | Status: AC

## 2022-03-01 MED ORDER — TRANEXAMIC ACID-NACL 1000-0.7 MG/100ML-% IV SOLN
INTRAVENOUS | Status: AC
  Administered 2022-03-01: 1000 mg
  Filled 2022-03-01: qty 100

## 2022-03-01 MED ORDER — LACTATED RINGERS IV SOLN: INTRAVENOUS | Status: DC

## 2022-03-01 MED ORDER — BENZOCAINE-MENTHOL 20-0.5 % EX AERO: 1.0000 "application " | INHALATION_SPRAY | CUTANEOUS | Status: DC | PRN

## 2022-03-01 MED ORDER — CARBOPROST TROMETHAMINE 250 MCG/ML IM SOLN
INTRAMUSCULAR | Status: AC
  Filled 2022-03-01: qty 1

## 2022-03-01 MED ORDER — CEFAZOLIN SODIUM-DEXTROSE 2-4 GM/100ML-% IV SOLN
2.0000 g | Freq: Once | INTRAVENOUS | Status: AC
  Administered 2022-03-01: 2 g via INTRAVENOUS
  Filled 2022-03-01: qty 100

## 2022-03-01 MED ORDER — SIMETHICONE 80 MG PO CHEW: 80.0000 mg | CHEWABLE_TABLET | ORAL | Status: DC | PRN

## 2022-03-01 MED ORDER — SENNOSIDES-DOCUSATE SODIUM 8.6-50 MG PO TABS
2.0000 | ORAL_TABLET | ORAL | Status: DC
  Administered 2022-03-02: 2 via ORAL
  Filled 2022-03-01 (×2): qty 2

## 2022-03-01 MED ORDER — FENTANYL CITRATE (PF) 100 MCG/2ML IJ SOLN
INTRAMUSCULAR | Status: AC
  Filled 2022-03-01: qty 4

## 2022-03-01 MED ORDER — MISOPROSTOL 200 MCG PO TABS
ORAL_TABLET | ORAL | Status: AC
  Filled 2022-03-01: qty 5

## 2022-03-01 MED ORDER — DIPHENHYDRAMINE HCL 25 MG PO CAPS
25.0000 mg | ORAL_CAPSULE | Freq: Once | ORAL | Status: AC
  Administered 2022-03-01: 25 mg via ORAL
  Filled 2022-03-01: qty 1

## 2022-03-01 MED ORDER — ACETAMINOPHEN 325 MG PO TABS
650.0000 mg | ORAL_TABLET | ORAL | Status: DC | PRN
  Administered 2022-03-01: 650 mg via ORAL
  Filled 2022-03-01: qty 2

## 2022-03-01 MED ORDER — DIBUCAINE (PERIANAL) 1 % EX OINT: 1.0000 "application " | TOPICAL_OINTMENT | CUTANEOUS | Status: DC | PRN

## 2022-03-01 MED ORDER — TRANEXAMIC ACID-NACL 1000-0.7 MG/100ML-% IV SOLN: 1000.0000 mg | Freq: Once | INTRAVENOUS | Status: DC | PRN

## 2022-03-01 MED ORDER — WITCH HAZEL-GLYCERIN EX PADS: 1.0000 "application " | MEDICATED_PAD | CUTANEOUS | Status: DC | PRN

## 2022-03-01 MED ORDER — DIPHENHYDRAMINE HCL 25 MG PO CAPS: 25.0000 mg | ORAL_CAPSULE | Freq: Four times a day (QID) | ORAL | Status: DC | PRN

## 2022-03-01 MED ORDER — TETANUS-DIPHTH-ACELL PERTUSSIS 5-2.5-18.5 LF-MCG/0.5 IM SUSY: 0.5000 mL | PREFILLED_SYRINGE | Freq: Once | INTRAMUSCULAR | Status: DC

## 2022-03-01 MED ORDER — ZOLPIDEM TARTRATE 5 MG PO TABS: 5.0000 mg | ORAL_TABLET | Freq: Every evening | ORAL | Status: DC | PRN

## 2022-03-01 NOTE — Progress Notes (Addendum)
LABOR PROGRESS NOTE  Sonya Harris is a 21 y.o. G1P0 at [redacted]w[redacted]d admitted for IOL for gHTN   Subjective: Patient is feeling comfortable with epidural in place.   Objective: BP 138/82   Pulse 88   Temp 98.2 F (36.8 C) (Oral)   Resp 16   Ht '5\' 5"'$  (1.651 m)   Wt 88.1 kg   LMP 06/08/2021 (Approximate)   SpO2 100%   BMI 32.33 kg/m  or  Vitals:   03/01/22 0600 03/01/22 0630 03/01/22 0700 03/01/22 0800  BP: 111/75 119/65 125/65 138/82  Pulse: (!) 102 73 84 88  Resp: '18 16 16   '$ Temp:   98.2 F (36.8 C)   TempSrc:   Oral   SpO2:      Weight:      Height:       Dilation: 7 Effacement (%): 60 Station: -1 Presentation: Vertex Exam by:: VGifford Shave MD FHT: baseline rate 130 , moderate varibility, + acel, no decel Toco: 2 min  Labs: Lab Results  Component Value Date   WBC 11.0 (H) 02/28/2022   HGB 11.9 (L) 02/28/2022   HCT 35.6 (L) 02/28/2022   MCV 83.0 02/28/2022   PLT 185 02/28/2022    Patient Active Problem List   Diagnosis Date Noted   Gestational hypertension 02/28/2022   Gestational hypertension affecting first pregnancy 02/21/2022   Supervision of normal first pregnancy 09/04/2021   Allergic rhinitis 05/01/2012   ECZEMA, ATOPIC DERMATITIS 11/20/2006    Assessment / Plan: 21y.o. G1P0 at 330w1dere for IOL for gHTN   Labor: Pitocin currently at 10. Difficulty tracing contractions so IUPC placed. Continue to monitor progression of labor.  gHTN: Mild range Bps since last check. Have required no treatment. Continue to monitor.  Fetal Wellbeing:  Cat 1, reassuring  Pain Control:  epidural in place  Anticipated MOD:  Vaginal delivery  ViGifford ShaveMD  PGY-3, [facility]  03/01/2022, 8:14 AM

## 2022-03-01 NOTE — Progress Notes (Signed)
On admission to [facility], 1st fundal rub there was 321 blood loss with clots. Chloe Winecoff, RN came in to assist. Pt's uterus was firm u-1 for several intermittent checks in the next 10 min following initial clots.  No more clots were seen. RN left room and told patient and support person to call if anything else felt off/ bleeding/pain.  Pt called out with pain as RN was heading to the room with pain meds. RN found patient on her side with blood and stating she could not hear. RN called emergency button and asked secretary to get help. (Voicera was down due to system updates). Kristen Lassiter entered the room and helped with fundal massage. Several more clots came out. RN pulled code lever on the wall since there was no one else physically there yet. Full team came and the patent received TXA, Pitocin, Anitbotics, and Methergine. Midwife performed uterine sweep. Several clots removed. MD ordered labs for later. RN did several checks prior to leaving the room.

## 2022-03-01 NOTE — Anesthesia Preprocedure Evaluation (Signed)
Anesthesia Evaluation  Patient identified by MRN, date of birth, ID band Patient awake    Reviewed: Allergy & Precautions, H&P , NPO status , Patient's Chart, lab work & pertinent test results  History of Anesthesia Complications Negative for: history of anesthetic complications  Airway Mallampati: II  TM Distance: >3 FB Neck ROM: full    Dental no notable dental hx. (+) Teeth Intact   Pulmonary    Pulmonary exam normal breath sounds clear to auscultation       Cardiovascular hypertension, Normal cardiovascular exam Rhythm:regular Rate:Normal     Neuro/Psych Seizures -,     GI/Hepatic   Endo/Other    Renal/GU      Musculoskeletal   Abdominal (+) + obese,   Peds  Hematology  (+) Blood dyscrasia, anemia ,   Anesthesia Other Findings   Reproductive/Obstetrics (+) Pregnancy                             Anesthesia Physical Anesthesia Plan  ASA: 2  Anesthesia Plan: Epidural   Post-op Pain Management:    Induction:   PONV Risk Score and Plan:   Airway Management Planned:   Additional Equipment:   Intra-op Plan:   Post-operative Plan:   Informed Consent: I have reviewed the patients History and Physical, chart, labs and discussed the procedure including the risks, benefits and alternatives for the proposed anesthesia with the patient or authorized representative who has indicated his/her understanding and acceptance.       Plan Discussed with:   Anesthesia Plan Comments:         Anesthesia Quick Evaluation

## 2022-03-01 NOTE — Progress Notes (Signed)
Patient ID: Sonya Harris, female   DOB: 2001/03/16, 21 y.o.   MRN: [id number] Doing well Starting to have some pain  Vitals:   02/28/22 2205 02/28/22 2304 02/28/22 2345 03/01/22 0027  BP: (!) 148/97 (!) 146/90 139/82 131/75  Pulse: 84 90 78 76  Resp: '18 18 18 18  '$ Temp:  98.6 F (37 C)    TempSrc:      SpO2:      Weight:      Height:       FHR reassuring UCs q 3-57mn  Cervix exam deferred

## 2022-03-01 NOTE — Anesthesia Procedure Notes (Signed)
Epidural Patient location during procedure: OB Start time: 03/01/2022 5:06 AM End time: 03/01/2022 5:16 AM  Staffing Anesthesiologist: Murvin Natal, MD Performed: anesthesiologist   Preanesthetic Checklist Completed: patient identified, IV checked, site marked, risks and benefits discussed, monitors and equipment checked, pre-op evaluation and timeout performed  Epidural Patient position: sitting Prep: DuraPrep Patient monitoring: heart rate, cardiac monitor, continuous pulse ox and blood pressure Approach: midline Location: L4-L5 Injection technique: LOR air  Needle:  Needle type: Tuohy  Needle gauge: 17 G Needle length: 9 cm Needle insertion depth: 6 cm Catheter type: closed end flexible Catheter size: 19 Gauge Catheter at skin depth: 11 cm Test dose: negative and 1.5% lidocaine with Epi 1:200 K  Assessment Events: blood not aspirated, injection not painful, no injection resistance and negative IV test  Additional Notes Informed consent obtained prior to proceeding including risk of failure, 1% risk of PDPH, risk of minor discomfort and bruising. Discussed alternatives to epidural analgesia and patient desires to proceed.  Timeout performed pre-procedure verifying patient name, procedure, and platelet count.  Patient tolerated procedure well. Reason for block:procedure for pain

## 2022-03-01 NOTE — Lactation Note (Addendum)
This note was copied from a baby's chart. Lactation Consultation Note  Patient Name: Sonya Harris [id number] Date: 03/01/2022 Reason for consult: Initial assessment;1st time breastfeeding;Early term 37-38.6wks;Infant < 6lbs Age:21 years, ETI female infant mom with PPH. Mom's feeding choice is breast and formula feeding. Per mom, infant had 12 mls of formula at 1530 pm and two voids since birth. Per mom, she initially wanted to "pump only" today but after using the DEBP, she did not like the way the pump felt " its weird", I will not pump. Mom will think about maybe latching infant later today after having some  rest, mom  currently has a headache,RN is aware, NT dim lights in room, mom declined cool cloth on her forehead.  Mom will call RN/LC if she would like to latch infant  later today,  if this is her choice. LC suggested mom use hand pump from DEBP kit , to pre-pump breast prior to latching infant to help evert nipple shaft out more.  Per mom, she does know how to hand express she was shown by RN, earlier today and she knows she can hand express and give infant back her EBM by spoon if she chooses. Mom will continue to breastfeed infant according to hunger cues, on demand, 8 to 12+ times within 24 hours, skin to skin. Mom knows if infant doesn't latch infant intake on Day 1 ( 5-15 mls) per feeding EBM/Formula. Mom made aware of O/P services, breastfeeding support groups, community resources, and our phone # for post-discharge questions.   Maternal Data Has patient been taught Hand Expression?: Yes Does the patient have breastfeeding experience prior to this delivery?: No  Feeding Mother's Current Feeding Choice: Breast Milk and Formula  LATCH Score                    Lactation Tools Discussed/Used Tools: Pump Breast pump type: Double-Electric Breast Pump Pump Education: Setup, frequency, and cleaning;Milk Storage Reason for Pumping: Initally mom wanted to pump until she  had rest, but doesn't like how pump feels (Mom has not latch infant, did have [place], initally she was going pump only first day and then maybe latch infant tomorrow but her feeding plan changed after using the DEBP.) Pumping frequency: Mom plans to wait before pumping  Interventions Interventions: Breast feeding basics reviewed;Skin to skin;DEBP;Education;Pace feeding;LC Services brochure  Discharge    Consult Status Consult Status: Follow-up Date: 03/02/22 Follow-up type: In-patient    Vicente Serene 03/01/2022, 4:56 PM

## 2022-03-01 NOTE — Progress Notes (Signed)
Patient is s/p nsvd earlier this morning. I was called to attend postpartum hemorrhage. Large clots encountered by patient's RN. No lacerations at delivery. SVE by Fonnie Birkenhead reveals large amount of clot in LUS and fundus which was manually evacuated. Fundus firm afterward and bleeding controlled. Hemodynamically stable. We have ordered ancef 2 g, methergine 0.2 mg IM x1, txa 1 g x1, and will finish infusion of pitocin. QBL is 1592. Will check labs now and again tomorrow morning. Will prepare 2 units for possible transfusion. Plan discussed with patient and family members. Next intervention if continued bleeding would be Jada device. Verbal consent from patient for transfusion if indicated.

## 2022-03-01 NOTE — Discharge Summary (Signed)
Postpartum Discharge Summary     Patient Name: Sonya Harris DOB: [birth year] MRN: [id number]  Date of admission: 02/28/2022 Delivery date:03/01/2022  Delivering provider: Gifford Shave  Date of discharge: 03/03/2022  Admitting diagnosis: Gestational hypertension [O13.9] Intrauterine pregnancy: [redacted]w[redacted]d    Secondary diagnosis:  Principal Problem:   Gestational hypertension Active Problems:   Gestational thrombocytopenia ([place]  Additional problems: [place]    Discharge diagnosis: Gestational Hypertension                                              Post partum procedures:blood transfusion x 2u PRBCs on PPD#0 Augmentation: AROM, Pitocin, Cytotec, and IP Foley Complications: [id number] Hospital course: Induction of Labor With Vaginal Delivery   21y.o. yo G1P1001 at 365w2das admitted to the hospital 02/28/2022 for induction of labor.  Indication for induction: Gestational hypertension.  Patient had an uncomplicated labor course as follows: Membrane Rupture Time/Date: 5:53 AM ,03/01/2022   Delivery Method:Vaginal, Spontaneous  Episiotomy: None  Lacerations:  None  Details of delivery can be found in separate delivery note.  Patient had a postpartum course remarkable for receiving a blood tx of 2u PRBCs on PPD#0 for symptomatic anemia and Hgb drop from 11.4>8.3. Post tx Hgb was 9.2. On PPD#1 she was started on ProcardiaXL 3065mnd Lasix 7m3m5d for mild range BP elevations. Patient is discharged home 03/03/22.  Newborn Data: Birth date:03/01/2022  Birth time:10:29 AM  Gender:Female  Living status:Living  Apgars:8 ,9  Weight:2320 g   Magnesium Sulfate received: No BMZ received: No Rhophylac:N/A MMR:N/A T-DaP:Given prenatally Flu: N/A Transfusion:Yes  Physical exam  Vitals:   03/02/22 2035 03/02/22 2153 03/03/22 0130 03/03/22 0542  BP: (!) 149/72 137/90  134/73  Pulse: 75 79  71  Resp: 18     Temp:      TempSrc: Oral     SpO2: 99%  (!) 0%   Weight:      Height:        General: alert Lochia: appropriate Uterine Fundus: firm Incision: N/A DVT Evaluation: No evidence of DVT seen on physical exam. Labs: Lab Results  Component Value Date   WBC 9.7 03/02/2022   HGB 9.2 (L) 03/02/2022   HCT 26.6 (L) 03/02/2022   MCV 82.4 03/02/2022   PLT 123 (L) 03/02/2022      Latest Ref Rng & Units 02/28/2022    9:41 AM  CMP  Glucose 70 - 99 mg/dL 86   BUN 6 - 20 mg/dL <5   Creatinine 0.44 - 1.00 mg/dL 0.51   Sodium 135 - 145 mmol/L 137   Potassium 3.5 - 5.1 mmol/L 3.8   Chloride 98 - 111 mmol/L 108   CO2 22 - 32 mmol/L 22   Calcium 8.9 - 10.3 mg/dL 9.0   Total Protein 6.5 - 8.1 g/dL 6.1   Total Bilirubin 0.3 - 1.2 mg/dL 0.6   Alkaline Phos 38 - 126 U/L 138   AST 15 - 41 U/L 19   ALT 0 - 44 U/L 15    Edinburgh Score:     No data to display           After visit meds:  Allergies as of 03/03/2022   No Known Allergies      Medication List     STOP taking these medications    aspirin  EC 81 MG tablet       TAKE these medications    acetaminophen 325 MG tablet Commonly known as: Tylenol Take 2 tablets (650 mg total) by mouth every 4 (four) hours as needed (for pain scale < 4).   docusate sodium 100 MG capsule Commonly known as: COLACE Take 1 capsule (100 mg total) by mouth 2 (two) times daily as needed.   Ferrous Fumarate 324 (106 Fe) MG Tabs tablet Commonly known as: HEMOCYTE - 106 mg FE Take 1 tablet (106 mg of iron total) by mouth every other day. Start taking on: March 04, 2022   furosemide 20 MG tablet Commonly known as: LASIX Take 1 tablet (20 mg total) by mouth daily for 4 days.   ibuprofen 600 MG tablet Commonly known as: ADVIL Take 1 tablet (600 mg total) by mouth every 6 (six) hours as needed.   NIFEdipine 30 MG 24 hr tablet Commonly known as: ADALAT CC Take 1 tablet (30 mg total) by mouth daily.   prenatal multivitamin Tabs tablet Take 1 tablet by mouth daily at 12 noon.         Discharge home in stable  condition Infant Feeding: Breast Infant Disposition:home with mother Discharge instruction: per After Visit Summary and Postpartum booklet. Activity: Advance as tolerated. Pelvic rest for 6 weeks.  Diet: routine diet Future Appointments:No future appointments.  Follow up Visit:  [place] for [facility] Nizh[facility] at [facility]. Go in 5 day(s).   Specialty: Obstetrics and Gynecology Why: call the office on tuesday if you haven't heard about your blood pressure check visit Contact information: [place] [phone]               Message sent to [facility] 03/01/22 Please schedule this patient for a In person postpartum visit in 4 weeks with the following provider:  Dr. Caron Presume . Additional Postpartum F/U:BP check 1 week  Low risk pregnancy complicated by: HTN Delivery mode:  Vaginal, Spontaneous  Anticipated Birth Control:  POPs   03/03/2022 Aletha Halim, MD

## 2022-03-02 LAB — CBC
HCT: 26.6 % — ABNORMAL LOW (ref 36.0–46.0)
Hemoglobin: 9.2 g/dL — ABNORMAL LOW (ref 12.0–15.0)
MCH: 28.5 pg (ref 26.0–34.0)
MCHC: 34.6 g/dL (ref 30.0–36.0)
MCV: 82.4 fL (ref 80.0–100.0)
Platelets: 123 10*3/uL — ABNORMAL LOW (ref 150–400)
RBC: 3.23 MIL/uL — ABNORMAL LOW (ref 3.87–5.11)
RDW: 13.3 % (ref 11.5–15.5)
WBC: 9.7 10*3/uL (ref 4.0–10.5)
nRBC: 0 % (ref 0.0–0.2)

## 2022-03-02 MED ORDER — FERROUS FUMARATE 324 (106 FE) MG PO TABS
1.0000 | ORAL_TABLET | ORAL | Status: DC
  Administered 2022-03-02: 106 mg via ORAL
  Filled 2022-03-02: qty 1

## 2022-03-02 MED ORDER — FUROSEMIDE 20 MG PO TABS
20.0000 mg | ORAL_TABLET | Freq: Every day | ORAL | Status: DC
  Administered 2022-03-03: 20 mg via ORAL
  Filled 2022-03-02 (×2): qty 1

## 2022-03-02 MED ORDER — NIFEDIPINE ER OSMOTIC RELEASE 30 MG PO TB24
30.0000 mg | ORAL_TABLET | Freq: Every day | ORAL | Status: DC
  Administered 2022-03-02: 30 mg via ORAL
  Filled 2022-03-02: qty 1

## 2022-03-02 NOTE — Anesthesia Postprocedure Evaluation (Signed)
Anesthesia Post Note  Patient: Sonya Harris  Procedure(s) Performed: AN AD [place]     Patient location during evaluation: Mother Baby Anesthesia Type: Epidural Level of consciousness: awake and alert Pain management: pain level controlled Vital Signs Assessment: post-procedure vital signs reviewed and stable Respiratory status: spontaneous breathing, nonlabored ventilation and respiratory function stable Cardiovascular status: stable Postop Assessment: no headache, no backache and epidural receding Anesthetic complications: no   No notable events documented.  Last Vitals:  Vitals:   03/02/22 0232 03/02/22 0430  BP: 124/75 130/79  Pulse: 88 (!) 105  Resp: 19 18  Temp: 37 C 36.8 C  SpO2: 100% 100%    Last Pain:  Vitals:   03/02/22 0430  TempSrc: Oral  PainSc:    Pain Goal:                   Stefani Dama

## 2022-03-02 NOTE — Lactation Note (Addendum)
This note was copied from a baby's chart. Lactation Consultation Note  Patient Name: Sonya Harris PJASN'K Date: 03/02/2022 Reason for consult: Follow-up assessment;Mother's request;Exclusive pumping and bottle feeding;Early term 37-38.6wks;Infant < 6lbs;Breastfeeding assistance;Other (Comment) (PPH (blood transfusion)) Age:22 hours  Mom set up on DEBP with nipple sensitivity pumping with 27 flange with coconut oil, one breast at a time. [place] asked Mom wanted to latch, preferred to work on pumping and getting used to flange. Dad still bottle feeding infant at the time [facility] visit.   Plan 1. To feed based on cues 8-12x 24hr period.  2. Mom to supplement offering EBM first followed by formula with pace bottle feeding and slow flow nipple. Mom advancing volume since infant not going to breast  3 Post pumping after latching as stated above for 15 min on initial setting.   All questions answered at the end of the visit.   Maternal Data Has patient been taught Hand Expression?: Yes  Feeding Mother's Current Feeding Choice: Breast Milk and Formula Nipple Type: Slow - flow  LATCH Score                    Lactation Tools Discussed/Used Tools: Pump;Flanges;Coconut oil Flange Size: 27 Breast pump type: Double-Electric Breast Pump Pump Education: Setup, frequency, and cleaning;Milk Storage Reason for Pumping: increase stimulation Pumping frequency: post pump after latching for 15 mins on initial setting (Mom has sensitive nipples prior to pumping or latching. Mom using coconut oil in flange pumping with 27 one breast at time for 15 min using hands free bra ( states comfortable fit))  Interventions Interventions: Breast feeding basics reviewed;Hand express;Expressed milk;DEBP;Pace feeding;Education;LC Services brochure;Infant Driven Feeding Algorithm education;LPT handout/interventions  Discharge    Consult Status Consult Status: Follow-up Date: 03/03/22 Follow-up type:  In-patient    Sonya Janvrin  Harris 03/02/2022, 8:32 PM

## 2022-03-02 NOTE — Progress Notes (Signed)
Received order to hang two units of blood for patient. Got consent from patient and MD spoke with her to go over risk and benefits. Patient agreed with the blood transfusion. Started the blood transfusion 03/01/22 at 2225, hung both units, patient did not have any reactions to the blood during the transfusions.

## 2022-03-02 NOTE — Progress Notes (Signed)
Post Partum Day #1 Subjective: no complaints, up ad lib, and tolerating PO; feeling much better since blood tx of 2uPRBCs last night- no s/s anemia; breast and bottlefeeding; plans on POPs for contraception; chart review shows criteria met for 3 BPs >130/80  Objective: Blood pressure 130/79, pulse (!) 105, temperature 98.2 F (36.8 C), temperature source Oral, resp. rate 18, height 5' 5"  (1.651 m), weight 88.1 kg, last menstrual period 06/08/2021, SpO2 100 %, unknown if currently breastfeeding.  Physical Exam:  General: alert, cooperative, and no distress Lochia: appropriate Uterine Fundus: firm DVT Evaluation: No evidence of DVT seen on physical exam.  Recent Labs    03/01/22 1123 03/01/22 1757 03/02/22 0630  HGB 11.4* 8.3* 9.2*  HCT 35.3*  --  26.6*    Assessment/Plan: Plan for discharge tomorrow Start ProcardiaXL 22m and Lasix 259mqd x 5 Meds to beds and PP babyscripts ordered Fe QOD  LOS: 2 days   KiMyrtis SerNM 03/02/2022, 8:50 AM

## 2022-03-02 NOTE — Progress Notes (Signed)
Patient has some reservations about starting procardia and lasix today and feels like she would prefer to just keep an eye on her blood pressures a little longer before taking anything. Called Dr. Gwenlyn Perking to request that she speak with patient in more detail about risks/benefits. Dr. Gwenlyn Perking to come speak with patient. Maxwell Caul, Leretha Dykes [place]

## 2022-03-03 DIAGNOSIS — D696 Thrombocytopenia, unspecified: Secondary | ICD-10-CM | POA: Diagnosis not present

## 2022-03-03 LAB — BPAM RBC
Blood Product Expiration Date: 202307032359
Blood Product Expiration Date: 202307042359
ISSUE DATE / TIME: 202306092213
ISSUE DATE / TIME: 202306100205
Unit Type and Rh: 7300
Unit Type and Rh: 7300

## 2022-03-03 LAB — TYPE AND SCREEN
ABO/RH(D): B POS
Antibody Screen: NEGATIVE
Unit division: 0
Unit division: 0

## 2022-03-03 MED ORDER — DOCUSATE SODIUM 100 MG PO CAPS: 100.0000 mg | ORAL_CAPSULE | Freq: Two times a day (BID) | ORAL | 2 refills | Status: DC | PRN

## 2022-03-03 MED ORDER — ACETAMINOPHEN 325 MG PO TABS: 650.0000 mg | ORAL_TABLET | ORAL | Status: DC | PRN

## 2022-03-03 MED ORDER — FERROUS FUMARATE 324 (106 FE) MG PO TABS: 1.0000 | ORAL_TABLET | ORAL | 0 refills | Status: DC

## 2022-03-03 MED ORDER — FUROSEMIDE 20 MG PO TABS: 20.0000 mg | ORAL_TABLET | Freq: Every day | ORAL | 0 refills | Status: DC

## 2022-03-03 MED ORDER — NIFEDIPINE ER 30 MG PO TB24: 30.0000 mg | ORAL_TABLET | Freq: Every day | ORAL | 0 refills | Status: DC

## 2022-03-03 MED ORDER — IBUPROFEN 600 MG PO TABS: 600.0000 mg | ORAL_TABLET | Freq: Four times a day (QID) | ORAL | 0 refills | Status: DC | PRN

## 2022-03-03 NOTE — Progress Notes (Signed)
Assisted patient to set up babyscripts app per order. Patient entered in blood pressure this morning. Verified that blood pressure can be viewed under episodes of care. Patient has received blood pressure cuff and was instructed on how to use. Maxwell Caul, Leretha Dykes [place]

## 2022-03-03 NOTE — Lactation Note (Signed)
This note was copied from a baby's chart. Lactation Consultation Note  Patient Name: Sonya Harris NOMVE'H Date: 03/03/2022 Reason for consult: Follow-up assessment;Primapara;1st time breastfeeding;Early term 37-38.6wks Age:22 hours  LC in to room prior to discharge.  Baby is sleeping upon arrival. Mother reports formula feeding mostly, she continues pumping every 3h. Mother states she is collecting ~25-mL of colostrum. Family is pace bottle-feeding and have volume guidelines for formula feeding. Discussed normal behavior and patterns after 24h, voids and stools as signs good intake, pumping, clusterfeeding, skin to skin. Talked about milk coming into volume and managing engorgement.   Plan: 1-Feeding on demand or 8-12 times in 24h period. 2-Hand express/pump as needed for supplementation 3-Encouraged maternal rest, hydration and food intake.   Contact LC as needed for feeds/support/concerns/questions. All questions answered at this time. Reviewed [place] brochure.     Feeding Mother's Current Feeding Choice: Breast Milk and Formula  Lactation Tools Discussed/Used Tools: Pump Breast pump type: Double-Electric Breast Pump Reason for Pumping: stimulation and supplementation Pumping frequency: at least 8 times Pumped volume: 25 mL  Interventions Interventions: Breast feeding basics reviewed;Skin to skin;Expressed milk;Coconut oil;DEBP;Hand pump;Education;LC Services brochure;Pace feeding  Discharge Discharge Education: Engorgement and breast care;Warning signs for feeding baby Pump: DEBP;Manual  Consult Status Consult Status: Complete Date: 03/03/22 Follow-up type: Call as needed    [place] 03/03/2022, 9:19 AM

## 2022-03-03 NOTE — Discharge Instructions (Signed)
Continue to check your blood pressures twice a day Call the office for blood pressures that are consistently above 150 for the top number or 100 for the bottom number   Hypertension During Pregnancy Hypertension is also called high blood pressure. High blood pressure means that the force of your blood moving in your body is too strong. It can cause problems for you and your baby. Different types of high blood pressure can happen during pregnancy. The types are: High blood pressure before you got pregnant. This is called chronic hypertension.  This can continue during your pregnancy. Your doctor will want to keep checking your blood pressure. You may need medicine to keep your blood pressure under control while you are pregnant. You will need follow-up visits after you have your baby. High blood pressure that goes up during pregnancy when it was normal before. This is called gestational hypertension. It will usually get better after you have your baby, but your doctor will need to watch your blood pressure to make sure that it is getting better. Very high blood pressure during pregnancy. This is called preeclampsia. Very high blood pressure is an emergency that needs to be checked and treated right away. You may develop very high blood pressure after giving birth. This is called postpartum preeclampsia. This usually occurs within 48 hours after childbirth but may occur up to 6 weeks after giving birth. This is rare. How does this affect me? If you have high blood pressure during pregnancy, you have a higher chance of developing high blood pressure: As you get older. If you get pregnant again. In some cases, high blood pressure during pregnancy can cause: Stroke. Heart attack. Damage to the kidneys, lungs, or liver. Preeclampsia. Jerky movements you cannot control (convulsions or seizures). Problems with the placenta.  What can I do to lower my risk?  Keep a healthy weight. Eat a healthy  diet. Follow what your doctor tells you about treating any medical problems that you had before becoming pregnant. It is very important to go to all of your doctor visits. Your doctor will check your blood pressure and make sure that your pregnancy is progressing as it should. Treatment should start early if a problem is found.  Follow these instructions at home:  Take your blood pressure 1-2 times per day. Call the office if your blood pressure is 155 or higher for the top number or 105 or higher for the bottom number.    Eating and drinking  Drink enough fluid to keep your pee (urine) pale yellow. Avoid caffeine. Lifestyle Do not use any products that contain nicotine or tobacco, such as cigarettes, e-cigarettes, and chewing tobacco. If you need help quitting, ask your doctor. Do not use alcohol or drugs. Avoid stress. Rest and get plenty of sleep. Regular exercise can help. Ask your doctor what kinds of exercise are best for you. General instructions Take over-the-counter and prescription medicines only as told by your doctor. Keep all prenatal and follow-up visits as told by your doctor. This is important. Contact a doctor if: You have symptoms that your doctor told you to watch for, such as: Headaches. Nausea. Vomiting. Belly (abdominal) pain. Dizziness. Light-headedness. Get help right away if: You have: Very bad belly pain that does not get better with treatment. A very bad headache that does not get better. Vomiting that does not get better. Sudden, fast weight gain. Sudden swelling in your hands, ankles, or face. Blood in your pee. Blurry vision. Double vision.  Shortness of breath. Chest pain. Weakness on one side of your body. Trouble talking. Summary High blood pressure is also called hypertension. High blood pressure means that the force of your blood moving in your body is too strong. High blood pressure can cause problems for you and your baby. Keep all  follow-up visits as told by your doctor. This is important. This information is not intended to replace advice given to you by your health care provider. Make sure you discuss any questions you have with your health care provider. Document Released: 10/12/2010 Document Revised: 12/31/2018 Document Reviewed: 10/06/2018 Elsevier Patient Education  2020 [employer].

## 2022-03-05 ENCOUNTER — Encounter: Payer: Self-pay | Admitting: Family Medicine

## 2022-03-05 ENCOUNTER — Ambulatory Visit (INDEPENDENT_AMBULATORY_CARE_PROVIDER_SITE_OTHER): Payer: Medicaid Other | Admitting: Family Medicine

## 2022-03-05 VITALS — BP 121/80 | HR 89 | Temp 98.7°F | Wt 182.2 lb

## 2022-03-05 DIAGNOSIS — Z0130 Encounter for examination of blood pressure without abnormal findings: Secondary | ICD-10-CM | POA: Diagnosis present

## 2022-03-05 DIAGNOSIS — D649 Anemia, unspecified: Secondary | ICD-10-CM | POA: Insufficient documentation

## 2022-03-05 DIAGNOSIS — D500 Iron deficiency anemia secondary to blood loss (chronic): Secondary | ICD-10-CM | POA: Diagnosis not present

## 2022-03-05 LAB — POCT HEMOGLOBIN: Hemoglobin: 10.1 g/dL — AB (ref 11–14.6)

## 2022-03-05 NOTE — Progress Notes (Signed)
    SUBJECTIVE:   CHIEF COMPLAINT / HPI:   Blood Pressure Check Sonya Harris is a 21 y.o. female G1P1 who presents to the [facility] clinic today for blood pressure check. She delivered on 6/9, induction for gestational hypertension at [redacted]w[redacted]d  Her postpartum course was remarkable for requiring 2 units of blood transfusion on postpartum day 0 for symptomatic anemia and hemoglobin drop to 8.3.  Posttreatment hemoglobin was 9.2.  She was started on Nifedipine 30 mg and Lasix 20 mg x 5 days for mild range blood pressure elevations.  She was discharged on 6/11.  She returns today for blood pressure check.  She checks her BP at home, ranges 1993Zsystolic over 616-96Vdiastolic.  Denies fatigue, lightheaded, dizzy. Taking iron supplements every other day. Notes bleeding is like a "light period".   PERTINENT  PMH / PSH:  Past Medical History:  Diagnosis Date   Pregnancy induced hypertension    Seizures ([place]    21 yr old   OBJECTIVE:   BP 121/80   Pulse 89   Temp 98.7 F (37.1 C)   Wt 182 lb 3.2 oz (82.6 kg)   LMP 06/08/2021 (Approximate)   SpO2 99%   BMI 30.32 kg/m    General: NAD, pleasant, able to participate in exam Cardiac: RRR, no murmurs. No edema Respiratory: CTAB, normal effort, No wheezes, rales or rhonchi Abdomen: Bowel sounds present, nontender to RUQ  Skin: warm and dry, no rashes noted Psych: Normal affect and mood  ASSESSMENT/PLAN:   Blood pressure check Post-partum day 4. Normotensive today. She is taking home BP and all have been <140/90. She is taking her nifedipine without adverse effects. Discussed importance of continued blood pressure checks and signs/symptoms of pre-eclampsia. Teach back performed.  -Continue Nifedipine  -Continue home BP checks -Strict return precautions provided -Follow-up in 6 weeks for postpartum visit  Anemia Required 2 units transfusion while in the hospital.  She has been taking her iron supplementation.  Denies any symptoms of anemia.   Hemoglobin on discharge was 9.2, slightly improved today to 10.1.   Sonya Harris DLos [place]errill[place]

## 2022-03-05 NOTE — Assessment & Plan Note (Addendum)
Required 2 units transfusion while in the hospital.  She has been taking her iron supplementation.  Denies any symptoms of anemia.  Hemoglobin on discharge was 9.2, slightly improved today to 10.1.

## 2022-03-05 NOTE — Patient Instructions (Addendum)
It was wonderful to see you today.  Today we talked about:  -Your blood pressure today was normal!  -Continue to take your medications as prescribed. -If you develop headaches that do not resolve with Tylenol or Motrin, right upper quadrant abdominal pain, swelling of your arms or legs, please seek immediate evaluation. -Continue your iron supplements.  Thank you for choosing [facility].   Please call [phone] with any questions about today's appointment.  Please be sure to schedule follow up at the front  desk before you leave today.   Sharion Settler, DO PGY-2 Family Medicine

## 2022-03-05 NOTE — Assessment & Plan Note (Signed)
Post-partum day 4. Normotensive today. She is taking home BP and all have been <140/90. She is taking her nifedipine without adverse effects. Discussed importance of continued blood pressure checks and signs/symptoms of pre-eclampsia. Teach back performed.  -Continue Nifedipine  -Continue home BP checks -Strict return precautions provided -Follow-up in 6 weeks for postpartum visit

## 2022-03-12 ENCOUNTER — Encounter: Payer: Self-pay | Admitting: Family Medicine

## 2022-03-13 ENCOUNTER — Encounter: Payer: Self-pay | Admitting: Obstetrics and Gynecology

## 2022-03-13 ENCOUNTER — Telehealth: Payer: Self-pay

## 2022-03-13 ENCOUNTER — Encounter: Payer: Self-pay | Admitting: Obstetrics & Gynecology

## 2022-03-13 NOTE — Telephone Encounter (Signed)
Patient calls nurse line reporting postpartum bleeding.   Patient reports she has been bleeding very "lightly" since delivery ~ 2 weeks ago. However, patient reports much heavier today. Patient denies any abdominal cramping or passing of clots and reports using one "heavy duty" pad so far today.   Patient denies any fevers, excessive fatigue, dizziness or weakness.   Baby has apt with [facility] tomorrow already and she would like to be seen as well due to hx of hemorrhage.   Patient scheduled with [facility] as well.   Red flags discussed in the meantime.

## 2022-03-13 NOTE — Progress Notes (Unsigned)
    SUBJECTIVE:   CHIEF COMPLAINT / HPI:   Postpartum bleeding: patient delivered vaginally after an induction for gestational hypertension and had a postpartum hemorrhage with >1027m blood loss on June 9th 2023. She received 2U pRBCs in the hospital. Her post transfusion hgb was 9.2. Her bleeding had improved ***, but she reports heavier bleeding that started yesterday.   IRON  Gestational hypertension: patient is on nifedipine 30 mg due to gestational hypertension and elevated BP levels while in the hospital. Today BP is *** controlled at  PERTINENT  PMH / [place] ***  OBJECTIVE:   LMP 06/08/2021 (Approximate)   ***  ASSESSMENT/PLAN:   No problem-specific Assessment & Plan notes found for this encounter.     CGladys Damme MD [place]

## 2022-03-14 ENCOUNTER — Encounter: Payer: Self-pay | Admitting: Family Medicine

## 2022-03-14 ENCOUNTER — Ambulatory Visit (INDEPENDENT_AMBULATORY_CARE_PROVIDER_SITE_OTHER): Payer: Medicaid Other | Admitting: Family Medicine

## 2022-03-14 VITALS — BP 128/90 | HR 72 | Wt 175.0 lb

## 2022-03-14 DIAGNOSIS — O139 Gestational [pregnancy-induced] hypertension without significant proteinuria, unspecified trimester: Secondary | ICD-10-CM

## 2022-03-14 DIAGNOSIS — D649 Anemia, unspecified: Secondary | ICD-10-CM

## 2022-03-14 DIAGNOSIS — O9081 Anemia of the puerperium: Secondary | ICD-10-CM

## 2022-03-14 NOTE — Patient Instructions (Addendum)
It was a pleasure to see you today!  If you have clots larger than the size of a golfball or bleed through more than 1 pad or tampon per hour, go directly to the emergency room Continue to monitor your blood pressure, goal is 120s/80s. We will get some labs today.  If they are abnormal or we need to do something about them, I will call you.  If they are normal, I will send you a message on MyChart (if it is active) or a letter in the mail.  If you don't hear from [place] in 2 weeks, please call the office  [phone]. Continue the good work with taking your vitamin and iron If you bleed again, you can try 600-800 mg of ibuprofen every 6 hours for 2-3 days    Be Well,  Dr. Chauncey [place]

## 2022-03-14 NOTE — Assessment & Plan Note (Signed)
Counseled patient to continue nifedipine 30 mg to the best of her ability.  Patient is appropriately checking her blood pressure and it is within normal range at home.  Recommend follow-up for 6-week post partum appointment

## 2022-03-14 NOTE — Assessment & Plan Note (Signed)
We will check CBC today.  Patient's bleeding has appropriately stopped.  Counseled on red flags including clots larger than a golf ball and bleeding through more than 1 pad or tampon per hour, recommend go directly to emergency room or call our office if she has questions.  Patient recommended to try ibuprofen 600 to 800 mg every 6 hours for 2 to 3 days if she has repeat bleeding that is minimal.

## 2022-03-15 LAB — CBC
Hematocrit: 36.4 % (ref 34.0–46.6)
Hemoglobin: 11.9 g/dL (ref 11.1–15.9)
MCH: 26.6 pg (ref 26.6–33.0)
MCHC: 32.7 g/dL (ref 31.5–35.7)
MCV: 81 fL (ref 79–97)
Platelets: 383 10*3/uL (ref 150–450)
RBC: 4.47 x10E6/uL (ref 3.77–5.28)
RDW: 12.3 % (ref 11.7–15.4)
WBC: 5.2 10*3/uL (ref 3.4–10.8)

## 2022-03-29 ENCOUNTER — Ambulatory Visit (INDEPENDENT_AMBULATORY_CARE_PROVIDER_SITE_OTHER): Payer: Commercial Managed Care - HMO | Admitting: Family Medicine

## 2022-03-29 ENCOUNTER — Encounter: Payer: Self-pay | Admitting: Family Medicine

## 2022-03-29 VITALS — BP 122/76 | HR 80 | Wt 172.0 lb

## 2022-03-29 DIAGNOSIS — Z0130 Encounter for examination of blood pressure without abnormal findings: Secondary | ICD-10-CM

## 2022-03-29 DIAGNOSIS — R1031 Right lower quadrant pain: Secondary | ICD-10-CM

## 2022-03-29 DIAGNOSIS — K5900 Constipation, unspecified: Secondary | ICD-10-CM

## 2022-03-29 NOTE — Patient Instructions (Addendum)
Thank you for coming to see me today. It was a pleasure. Today we discussed your blood pressure. I recommend stopping nifedipine as your BP are in the normal range. Keep checking BP at home and contact us if your Bps are high >140/90.  Please follow-up with [place] as needed   If you have any questions or concerns, please do not hesitate to call the office at [phone].  Best wishes,   Dr Posey Pronto

## 2022-03-29 NOTE — Progress Notes (Signed)
     SUBJECTIVE:   CHIEF COMPLAINT / HPI:   Sonya Harris is a 21 y.o. female presents for post partum visit   Postpartum visit: patient is 4 weeks postpartum following a vaginal delivery. -Breastfeeding: yes -Contraception: no -Bleeding: no -Bonding with infant: good -Sexual activity: no  -Mood: yes   Edinburgh score: 0   Gestational HTN  Not taking nifedipine anymore as she does not feel comfortable taking it. She stopped it 2 weeks post partum. Bps at home have been 129/81, 122/76 etc. Highest 135/83.   Lower abdominal pain Right lower quadrant pain started this morning.  4/10 severity. Currently asymptomatic. Has been a little constipated recently. Denies vaginal discharge, vaginal bleeding, fevers, back pain, dysuria, frequency, urgency or hematuria,  Nausea,vomiting, diarrhea, rectal bleeding or melena.  No history of renal colic.  [facility] from 03/29/2022 in [place]  PHQ-9 Total Score 0        PERTINENT  PMH / [place]: gestational HTN   OBJECTIVE:   BP 122/76   Pulse 80   Wt 172 lb (78 kg)   LMP 06/08/2021 (Approximate)   SpO2 98%   BMI 28.62 kg/m    General: Alert, no acute distress Cardio: well perfused  Pulm: normal work of breathing Abdomen: Bowel sounds normal. Abdomen soft and non-tender.  Gyn: normal uterine fundus  Extremities: No peripheral edema.  Neuro: Cranial nerves grossly intact   ASSESSMENT/PLAN:   Blood pressure check 4 weeks post partum. Pt is doing well. Overall her blood pressures have been normotensive. She has already stopped taking Nifedipine 2 weeks post partum. Discussed with Dr McDiarmid, it is reasonable that pt does not need to take Nifedipine anymore given normotensives Bps. Recommended pt keeps measuring Bps at home for the next few weeks. If above 140/90  persistently or becomes symptomatic with elevated blood pressures she should follow up at [facility] or [facility]. Pt expressed understanding and is  happy with the plan.  Constipation RLQ pain likely secondary to constipation.  Considered UTI however UA today which showed 1+ leukocytes, urine culture showed mixed growth urogenital flora.  Low suspicion for endometritis or appendicitis given benign abdominal exam. Recommended pt increases water and fibre intake. Follow up if persistent sx.   Postpartum care and examination Patient is not on any birth control.  Birth control counseling provided.  She will follow-up with PCP for further discussion for long-term birth control options.  Recommended she continues prenatal vitamins and uses condoms for birth control in the meantime.    Lattie Haw, MD PGY-3 [place]

## 2022-03-30 LAB — URINALYSIS
Bilirubin, UA: NEGATIVE
Glucose, UA: NEGATIVE
Ketones, UA: NEGATIVE
Nitrite, UA: NEGATIVE
Protein,UA: NEGATIVE
RBC, UA: NEGATIVE
Specific Gravity, UA: 1.021 (ref 1.005–1.030)
Urobilinogen, Ur: 1 mg/dL (ref 0.2–1.0)
pH, UA: 6.5 (ref 5.0–7.5)

## 2022-03-31 LAB — URINE CULTURE

## 2022-04-02 DIAGNOSIS — K5900 Constipation, unspecified: Secondary | ICD-10-CM | POA: Insufficient documentation

## 2022-04-02 NOTE — Assessment & Plan Note (Addendum)
RLQ pain likely secondary to constipation.  Considered UTI however UA today which showed 1+ leukocytes, urine culture showed mixed growth urogenital flora.  Low suspicion for endometritis or appendicitis given benign abdominal exam. Recommended pt increases water and fibre intake. Follow up if persistent sx.

## 2022-04-02 NOTE — Assessment & Plan Note (Addendum)
4 weeks post partum. Pt is doing well. Overall her blood pressures have been normotensive. She has already stopped taking Nifedipine 2 weeks post partum. Discussed with Dr McDiarmid, it is reasonable that pt does not need to take Nifedipine anymore given normotensives Bps. Recommended pt keeps measuring Bps at home for the next few weeks. If above 140/90  persistently or becomes symptomatic with elevated blood pressures she should follow up at [facility] or [facility]. Pt expressed understanding and is happy with the plan.

## 2022-04-02 NOTE — Assessment & Plan Note (Addendum)
Patient is not on any birth control.  Birth control counseling provided.  She will follow-up with PCP for further discussion for long-term birth control options.  Recommended she continues prenatal vitamins and uses condoms for birth control in the meantime.

## 2022-10-08 ENCOUNTER — Ambulatory Visit: Payer: Self-pay | Admitting: *Deleted

## 2022-10-08 NOTE — Telephone Encounter (Signed)
  Chief Complaint: vaginal bleeding heavier after taking abortion pill 08/25/22. Symptoms: bright red vaginal bleeding today after taking shower passed large blood clot size of golf ball. Changed 1 pad and 1/2 today. Took abortion pill 08/25/22 bleeding 4-8 weeks and noted heavier today .  Frequency: today  Pertinent Negatives: Patient denies dizziness, no fever, no abdominal pain. No severe bleeding  Disposition: '[]'$ ED /'[]'$ Urgent Care (no appt availability in office) / '[]'$ Appointment(In office/virtual)/ '[]'$  [place] [place] [facility]/ '[]'$ Home Care/ '[]'$ Refused Recommended Disposition /'[]'$ [place] [facility]/ '[x]'$  Follow-up with PCP Additional Notes:   Recommended to contact PCP or OBGYN tomorrow. If sx worsen go to ED.    Reason for Disposition  [1] Caller has NON-URGENT question AND [2] triager unable to answer question  Answer Assessment - Initial Assessment Questions 1. SYMPTOM: "What's the main symptom you're concerned about?" (e.g., abdomen pain, fever, passing tissue, vaginal bleeding)     Vaginal bleeding and passing clots today  2. ONSET: "When did bleeding   start?"     Today heavier bleeding with golf ball size clot after shower  3. DATE of ABORTION: "When did you first take the abortion medicines?" (e.g., typically take mifepristone on day 1 and misoprostol on day 2 or 3; some physicians may prescribe only misoprostol)     08/25/22 4. SEVERITY OF ABDOMEN PAIN:  "How bad is the pain?"  (e.g., Scale 1-10; mild, moderate, or severe)   - NONE (0): No pain.   - MILD (1-3): Doesn't interfere with normal activities, abdomen soft and not tender to touch.    - MODERATE (4-7): Interferes with normal activities or awakens from sleep, abdomen tender to touch.    - SEVERE (8-10): Excruciating pain, doubled over, unable to do any normal activities.       Denies pain 5. SEVERITY OF VAGINAL BLEEDING: "Describe the bleeding that you are having."    - SPOTTING: Spotting, or pinkish / brownish mucous  discharge; does not fill panty liner or pad    - MILD:  Less than 1 pad / hour; less than patient's usual menstrual bleeding   - MODERATE: 1-2 pads / hour; 1 menstrual cup every 6 hours; small-medium blood clots (e.g., pea, grape, small coin)   - SEVERE: Soaking 2 or more pads/hour for 2 or more hours; 1 menstrual cup every 2 hours; bleeding not contained by pads or continuous red blood from vagina; large blood clots (e.g., golf ball, large coin).      Bleeding after taking medication for abortion 08/25/22 and bleeding  lasted 4-8 weeks and now bleeding started back heavier. 6. FEVER: "Do you have a fever?" If Yes, ask: "What is your temperature, how was it measured, and when did it start?"     no 7. OTHER SYMPTOMS: "Are there any other symptoms?" (e.g., dizziness)     No  Protocols used: Abortion - Medication Induced Follow-up Call-A-AH

## 2022-10-09 ENCOUNTER — Inpatient Hospital Stay (HOSPITAL_COMMUNITY): Payer: Medicaid Other

## 2022-10-09 ENCOUNTER — Inpatient Hospital Stay (HOSPITAL_COMMUNITY)
Admission: EM | Admit: 2022-10-09 | Discharge: 2022-10-09 | Disposition: A | Discharge status: Home / Self Care | Payer: Medicaid Other | Attending: Obstetrics and Gynecology | Admitting: Obstetrics and Gynecology

## 2022-10-09 ENCOUNTER — Other Ambulatory Visit: Payer: Self-pay

## 2022-10-09 ENCOUNTER — Encounter (HOSPITAL_COMMUNITY): Payer: Self-pay

## 2022-10-09 ENCOUNTER — Ambulatory Visit: Payer: Self-pay | Admitting: *Deleted

## 2022-10-09 DIAGNOSIS — O209 Hemorrhage in early pregnancy, unspecified: Secondary | ICD-10-CM | POA: Diagnosis not present

## 2022-10-09 DIAGNOSIS — O034 Incomplete spontaneous abortion without complication: Secondary | ICD-10-CM

## 2022-10-09 DIAGNOSIS — Z3A00 Weeks of gestation of pregnancy not specified: Secondary | ICD-10-CM

## 2022-10-09 DIAGNOSIS — D252 Subserosal leiomyoma of uterus: Secondary | ICD-10-CM | POA: Diagnosis not present

## 2022-10-09 DIAGNOSIS — O2940 Spinal and epidural anesthesia induced headache during pregnancy, unspecified trimester: Secondary | ICD-10-CM | POA: Diagnosis not present

## 2022-10-09 DIAGNOSIS — R42 Dizziness and giddiness: Secondary | ICD-10-CM | POA: Insufficient documentation

## 2022-10-09 DIAGNOSIS — N939 Abnormal uterine and vaginal bleeding, unspecified: Secondary | ICD-10-CM | POA: Diagnosis not present

## 2022-10-09 DIAGNOSIS — O26819 Pregnancy related exhaustion and fatigue, unspecified trimester: Secondary | ICD-10-CM | POA: Diagnosis not present

## 2022-10-09 DIAGNOSIS — Z79899 Other long term (current) drug therapy: Secondary | ICD-10-CM | POA: Insufficient documentation

## 2022-10-09 DIAGNOSIS — R519 Headache, unspecified: Secondary | ICD-10-CM | POA: Insufficient documentation

## 2022-10-09 LAB — CBC
HCT: 31.7 % — ABNORMAL LOW (ref 36.0–46.0)
Hemoglobin: 9.8 g/dL — ABNORMAL LOW (ref 12.0–15.0)
MCH: 25 pg — ABNORMAL LOW (ref 26.0–34.0)
MCHC: 30.9 g/dL (ref 30.0–36.0)
MCV: 80.9 fL (ref 80.0–100.0)
Platelets: 295 10*3/uL (ref 150–400)
RBC: 3.92 MIL/uL (ref 3.87–5.11)
RDW: 13 % (ref 11.5–15.5)
WBC: 6 10*3/uL (ref 4.0–10.5)
nRBC: 0 % (ref 0.0–0.2)

## 2022-10-09 LAB — POCT PREGNANCY, URINE: Preg Test, Ur: NEGATIVE

## 2022-10-09 MED ORDER — OXYCODONE HCL 5 MG PO TABS
5.0000 mg | ORAL_TABLET | ORAL | 0 refills | Status: DC | PRN
  Filled 2022-10-09: qty 4, 1d supply, fill #0

## 2022-10-09 MED ORDER — MISOPROSTOL 200 MCG PO TABS
800.0000 ug | ORAL_TABLET | Freq: Once | ORAL | Status: AC
  Administered 2022-10-09: 800 ug via BUCCAL
  Filled 2022-10-09: qty 4

## 2022-10-09 MED ORDER — ONDANSETRON 4 MG PO TBDP
4.0000 mg | ORAL_TABLET | Freq: Four times a day (QID) | ORAL | 0 refills | Status: DC | PRN
  Filled 2022-10-09: qty 20, 5d supply, fill #0

## 2022-10-09 NOTE — ED Provider Triage Note (Signed)
Emergency Medicine Provider Triage Evaluation Note  Sonya Harris , a 22 y.o. female  was evaluated in triage.  Pt complains of heavy vaginal bleeding that began yesterday.  Patient reports she took prescribed medical abortion pills on December 3 and just stopped bleeding this past week, but started back yesterday.  She states that today she has been passing up to golf ball size clots and saturated a pad in less than an hour.  She reports mild headache and lightheadedness.  Denies syncope, abdominal pain, vaginal pain, vaginal discharge, fever.  Review of Systems  Positive: As above Negative: As above  Physical Exam  BP (!) 151/87   Pulse 94   Temp 98.2 F (36.8 C)   Resp 17   Ht '5\' 5"'$  (1.651 m)   Wt 78 kg   SpO2 100%   BMI 28.62 kg/m  Gen:   Awake, no distress   Resp:  Normal effort  MSK:   Moves extremities without difficulty  Other:    Medical Decision Making  Medically screening exam initiated at 5:26 PM.  Appropriate orders placed.  Sonya Harris was informed that the remainder of the evaluation will be completed by another provider, this initial triage assessment does not replace that evaluation, and the importance of remaining in the ED until their evaluation is complete.     Theressa Stamps R, [place] 10/09/22 1731

## 2022-10-09 NOTE — Telephone Encounter (Signed)
  Chief Complaint: continued vaginal bleeding  Symptoms: see NT encounter from 10/08/22. Vaginal bleeding worsening. Soaking pad 3:30- 4:20 quarter size clots to golf ball size clots today . Headache  Frequency: worsening since yesterday  Pertinent Negatives: Patient denies dizziness no lightheadedness, no severe abdominal pain.  Disposition: '[x]'$ ED /'[]'$ Urgent Care (no appt availability in office) / '[]'$ Appointment(In office/virtual)/ '[]'$  [facility]/ '[]'$ Home Care/ '[]'$ Refused Recommended Disposition /'[]'$ [facility]/ '[]'$  Follow-up with PCP Additional Notes:   Recommended to go to ED now.     Reason for Disposition  Patient sounds very sick or weak to the triager  Answer Assessment - Initial Assessment Questions 1. AMOUNT: "Describe the bleeding that you are having."    - SPOTTING: spotting, or pinkish / brownish mucous discharge; does not fill panty liner or pad    - MILD:  less than 1 pad / hour; less than patient's usual menstrual bleeding   - MODERATE: 1-2 pads / hour; 1 menstrual cup every 6 hours; small-medium blood clots (e.g., pea, grape, small coin)   - SEVERE: soaking 2 or more pads/hour for 2 or more hours; 1 menstrual cup every 2 hours; bleeding not contained by pads or continuous red blood from vagina; large blood clots (e.g., golf ball, large coin)      Soaking a pad from 3:30- 4:20 today quarter size to golf ball size clots noted with episodes of gushing bleeding  2. ONSET: "When did the bleeding begin?" "Is it continuing now?"     Ongoing  3. MENSTRUAL PERIOD: "When was the last normal menstrual period?" "How is this different than your period?"     See NT encounter from 10/08/22 4. REGULARITY: "How regular are your periods?"     Na  5. ABDOMEN PAIN: "Do you have any pain?" "How bad is the pain?"  (e.g., Scale 1-10; mild, moderate, or severe)   - MILD (1-3): doesn't interfere with normal activities, abdomen soft and not tender to touch    - MODERATE (4-7):  interferes with normal activities or awakens from sleep, abdomen tender to touch    - SEVERE (8-10): excruciating pain, doubled over, unable to do any normal activities      Light cramping now gone  6. PREGNANCY: "Is there any chance you are pregnant?" "When was your last menstrual period?"     Took pill for abortion 08/25/22 7. BREASTFEEDING: "Are you breastfeeding?"     Did not answer 8. HORMONE MEDICINES: "Are you taking any hormone medicines, prescription or over-the-counter?" (e.g., birth control pills, estrogen)     na 9. BLOOD THINNER MEDICINES: "Do you take any blood thinners?" (e.g., Coumadin / warfarin, Pradaxa / dabigatran, aspirin)     na 10. CAUSE: "What do you think is causing the bleeding?" (e.g., recent gyn surgery, recent gyn procedure; known bleeding disorder, cervical cancer, polycystic ovarian disease, fibroids)         Took abortion pill 08/25/22 and now worsening bleeding noted  11. HEMODYNAMIC STATUS: "Are you weak or feeling lightheaded?" If Yes, ask: "Can you stand and walk normally?"        Headache  12. OTHER SYMPTOMS: "What other symptoms are you having with the bleeding?" (e.g., passed tissue, vaginal discharge, fever, menstrual-type cramps)       Clots noted. Headache  Protocols used: Vaginal Bleeding - Abnormal-A-AH

## 2022-10-09 NOTE — MAU Provider Note (Signed)
History     [id number]  Arrival date and time: 10/09/22 1713    Chief Complaint  Patient presents with   Vaginal Bleeding   Headache     HPI Sonya Harris is a 22 y.o. with PMHx notable for recent TAB, who presents for vaginal bleeding.   Patient report she had TAB on 08/25/22, took misoprostol pills and immediately had uptick in bleeding and cramping Next day it eased up and she had diminishing cramping and bleeding She never stopped bleeding completely but was down to spotting until yesterday She then began to have significant bleeding, passing clots and felt a little light headed She called her clinic and was told to come to the ED for an evaluation She reports her periods prior to conception were light, lasted about five days This is very different from her usual period She currently has no abdominal pain Reports no significant bleeding since arrival to MAU  --/--/B POS Performed at [facility], [address] [place], [place] [address]  (06/09 1123)  OB History     Gravida  2   Para  1   Term  1   Preterm      AB  1   Living  1      SAB      IAB      Ectopic      Multiple  0   Live Births  1           Past Medical History:  Diagnosis Date   Pregnancy induced hypertension    Seizures ([place])    22 yr old    Past Surgical History:  Procedure Laterality Date   SP ARTHRO THUMB*R*      Family History  Problem Relation Age of Onset   Hypertension Mother    Hypertension Maternal Aunt     Social History   Socioeconomic History   Marital status: Single    Spouse name: Not on file   Number of children: Not on file   Years of education: Not on file   Highest education level: Not on file  Occupational History   Not on file  Tobacco Use   Smoking status: Never    Passive exposure: Yes   Smokeless tobacco: Never  Vaping Use   Vaping Use: Never used  Substance and Sexual Activity   Alcohol use: Not Currently    Comment: not  while preg   Drug use: Never   Sexual activity: Yes  Other Topics Concern   Not on file  Social History Narrative   Not on file   Social Determinants of Health   Financial Resource Strain: Not on file  Food Insecurity: Not on file  Transportation Needs: Not on file  Physical Activity: Not on file  Stress: Not on file  Social Connections: Not on file  Intimate Partner Violence: Not on file    No Known Allergies  No current facility-administered medications on file prior to encounter.   Current Outpatient Medications on File Prior to Encounter  Medication Sig Dispense Refill   Ferrous Fumarate (HEMOCYTE - 106 MG FE) 324 (106 Fe) MG TABS tablet Take 1 tablet (106 mg of iron total) by mouth every other day. 30 tablet 0   furosemide (LASIX) 20 MG tablet Take 1 tablet (20 mg total) by mouth daily for 4 days. 4 tablet 0   NIFEdipine (ADALAT CC) 30 MG 24 hr tablet Take 1 tablet (30 mg total) by mouth daily.  42 tablet 0   Prenatal Vit-Fe Fumarate-FA (PRENATAL MULTIVITAMIN) TABS tablet Take 1 tablet by mouth daily at 12 noon. 100 tablet 2     ROS Pertinent positives and negative per HPI, all others reviewed and negative  Physical Exam   BP 126/67   Pulse 69   Temp 98.8 F (37.1 C) (Oral)   Resp 16   Ht '5\' 5"'$  (1.651 m)   Wt 69.5 kg   SpO2 100%   BMI 25.51 kg/m   Patient Vitals for the past 24 hrs:  BP Temp Temp src Pulse Resp SpO2 Height Weight  10/09/22 1906 126/67 -- -- 69 -- -- -- --  10/09/22 1817 125/72 98.8 F (37.1 C) Oral 76 16 100 % '5\' 5"'$  (1.651 m) 69.5 kg  10/09/22 1722 -- -- -- -- -- -- '5\' 5"'$  (1.651 m) 78 kg  10/09/22 1721 (!) 151/87 98.2 F (36.8 C) -- 94 17 100 % -- --    Physical Exam Vitals reviewed.  Constitutional:      General: She is not in acute distress.    Appearance: She is well-developed. She is not diaphoretic.  Eyes:     General: No scleral icterus. Pulmonary:     Effort: Pulmonary effort is normal. No respiratory distress.  Abdominal:      General: There is no distension.     Palpations: Abdomen is soft.     Tenderness: There is no abdominal tenderness. There is no guarding or rebound.  Skin:    General: Skin is warm and dry.  Neurological:     Mental Status: She is alert.     Coordination: Coordination normal.      Cervical Exam    Bedside Ultrasound Not done  My interpretation: n/a   Labs Results for orders placed or performed during the hospital encounter of 10/09/22 (from the past 24 hour(s))  Pregnancy, urine POC     Status: None   Collection Time: 10/09/22  6:14 PM  Result Value Ref Range   Preg Test, Ur NEGATIVE NEGATIVE  CBC     Status: Abnormal   Collection Time: 10/09/22  6:29 PM  Result Value Ref Range   WBC 6.0 4.0 - 10.5 K/uL   RBC 3.92 3.87 - 5.11 MIL/uL   Hemoglobin 9.8 (L) 12.0 - 15.0 g/dL   HCT 31.7 (L) 36.0 - 46.0 %   MCV 80.9 80.0 - 100.0 fL   MCH 25.0 (L) 26.0 - 34.0 pg   MCHC 30.9 30.0 - 36.0 g/dL   RDW 13.0 11.5 - 15.5 %   Platelets 295 150 - 400 K/uL   nRBC 0.0 0.0 - 0.2 %    Imaging US PELVIC COMPLETE WITH TRANSVAGINAL  Result Date: 10/09/2022 CLINICAL DATA:  Vaginal bleeding EXAM: TRANSABDOMINAL AND TRANSVAGINAL ULTRASOUND OF PELVIS TECHNIQUE: Both transabdominal and transvaginal ultrasound examinations of the pelvis were performed. Transabdominal technique was performed for global imaging of the pelvis including uterus, ovaries, adnexal regions, and pelvic cul-de-sac. It was necessary to proceed with endovaginal exam following the transabdominal exam to visualize the uterus, endometrium, ovaries and adnexa. COMPARISON:  None available FINDINGS: Uterus Measurements: 8.6 x 4.4 x 5.4 cm = volume: 106 mL. 1.2 cm anterior fundal fibroid, subserosal. Endometrium Thickness: 12 mm in thickness. Highly vascular area within the endometrium and fundus concerning for retained products of conception. Right ovary Measurements: 3.1 x 2.0 x 2.1 cm = volume: 6.6 mL. Normal appearance/no adnexal  mass. Left ovary Measurements: 2.4 x 2.1 x 2.0  cm = volume: 5.4 mL. Normal appearance/no adnexal mass. Other findings Trace free fluid in the pelvis. IMPRESSION: No intrauterine pregnancy. Hypervascular endometrium in the fundus concerning for retained products of conception. Electronically Signed   By: Rolm Baptise M.D.   On: 10/09/2022 19:51    MAU Course  Procedures Lab Orders         CBC         Pregnancy, urine POC    No orders of the defined types were placed in this encounter.  Imaging Orders         US PELVIC COMPLETE WITH TRANSVAGINAL     MDM moderate  Assessment and Plan  #Vaginal bleeding Differential diagnosis includes resumption of regular menses vs retained products of conception. Bleeding is different from her usual menses though and patient never stopped bleeding completely though, so my suspicion for retained products is higher. Will obtain CBC to assess for significant drop (though patient is hemodynamically stable thankfully) and get a pelvic US to further assess.   Patient's [place] returned with findings concerning for retained POC's. Offered repeat misoprostol vs D&C, would like to avoid surgery for now and elects for repeat medical management. After counseling she elected for medical management. Given misoprostol 800 mcg buccal in MAU, sent Rx for zofran ODT and 4 tabs of '5mg'$  Oxycodone. Reviewed that cramping, bleeding are normal in the first few hours after taking the medication, but should eventually wane. Reviewed warning signs of heavy vaginal bleeding soaking through >1 pad per hour, crescendo abdominal pain, and fever. . Blood type --/--/B POS Performed at [facility], [place] [address] [place], [place] [address]  (06/09 1123), rhogam was not indicated.   Will send to have patient follow up in clinic in a few weeks.    Clarnce Flock, MD/MPH 10/09/22 8:20 PM

## 2022-10-09 NOTE — MAU Note (Signed)
Sonya Harris is a 22 y.o. here in MAU reporting: had TAB on Dec 3. Bleeding had tapered off and then this weekend the bleeding stopped. Yesterday started bleeding again. States it is heavy and is seeing a lot of clots. Has a headache and is feeling lightheaded. No cramping.  Onset of complaint: ongoing  Pain score: 1/10  Vitals:   10/09/22 1721 10/09/22 1817  BP: (!) 151/87 125/72  Pulse: 94 76  Resp: 17 16  Temp: 98.2 F (36.8 C) 98.8 F (37.1 C)  SpO2: 100% 100%     FHT:NA  Lab orders placed from triage: upt

## 2022-10-09 NOTE — ED Triage Notes (Addendum)
Reports took prescribed abortion pills on dec 3 and just stopped bleeding this past week and then started back yesterday bleeding and today passing golf ball size clots and saturated a pad 1530-1620.

## 2022-10-09 NOTE — ED Notes (Signed)
Transported to MAU

## 2022-10-10 ENCOUNTER — Other Ambulatory Visit (HOSPITAL_COMMUNITY): Payer: Self-pay

## 2022-10-18 ENCOUNTER — Encounter: Payer: Self-pay | Admitting: Family Medicine

## 2022-10-18 ENCOUNTER — Ambulatory Visit (INDEPENDENT_AMBULATORY_CARE_PROVIDER_SITE_OTHER): Payer: Medicaid Other | Admitting: Family Medicine

## 2022-10-18 ENCOUNTER — Other Ambulatory Visit (HOSPITAL_COMMUNITY): Payer: Self-pay

## 2022-10-18 VITALS — BP 118/75 | HR 87 | Temp 98.6°F | Ht 65.0 in | Wt 150.8 lb

## 2022-10-18 DIAGNOSIS — Z3A00 Weeks of gestation of pregnancy not specified: Secondary | ICD-10-CM | POA: Diagnosis not present

## 2022-10-18 DIAGNOSIS — Z309 Encounter for contraceptive management, unspecified: Secondary | ICD-10-CM

## 2022-10-18 DIAGNOSIS — O034 Incomplete spontaneous abortion without complication: Secondary | ICD-10-CM | POA: Diagnosis present

## 2022-10-18 DIAGNOSIS — D509 Iron deficiency anemia, unspecified: Secondary | ICD-10-CM

## 2022-10-18 MED ORDER — FERROUS FUMARATE 324 (106 FE) MG PO TABS
1.0000 | ORAL_TABLET | Freq: Every day | ORAL | 3 refills | Status: AC
  Filled 2022-10-18 – 2022-11-27 (×3): qty 30, 30d supply, fill #0

## 2022-10-18 MED ORDER — NORGESTIMATE-ETH ESTRADIOL 0.25-35 MG-MCG PO TABS
1.0000 | ORAL_TABLET | Freq: Every day | ORAL | 11 refills | Status: DC
  Filled 2022-10-18 – 2022-11-07 (×2): qty 28, 28d supply, fill #0
  Filled 2023-01-08: qty 28, 28d supply, fill #1
  Filled 2023-01-30: qty 28, 28d supply, fill #2
  Filled 2023-02-21 (×2): qty 28, 28d supply, fill #3
  Filled 2023-03-17: qty 28, 28d supply, fill #4
  Filled 2023-04-07: qty 28, 28d supply, fill #5

## 2022-10-18 NOTE — Progress Notes (Signed)
    SUBJECTIVE:   CHIEF COMPLAINT / HPI:   Abnormal uterine bleeding Elective medical abortion 12/03.Patient reports she bleed heavily right after as expected, but has continued to bleed. Flow then slowed down, blood was "old brown color".  Then last week, she started bleeding heavily again and passing clots.   She sought treatment at the MAU 1/17, TVUS concerning for retained products of conception, was given buccal misoprostol. She reports she stopped bleeding almost instantly. Now just with "pinkish" discharge. She does not believe she passed any tissue.   Today, she feels "completely normal". Denies fever chills, dizzy, light headed, abdominal pain. Has follow up with OBGYN next week.   Never before been on birth control. Reports she does not desire pregnancy in the next year or so. She is most interested in OCPs.  No migraine with aura No personal history of blood clots - DVT or PE No family history of Dvt/PE, clotting disorder Non smoker - occassionally smokes with ETOH, couple times/month  PERTINENT  PMH / PSH:  Patient Active Problem List   Diagnosis Date Noted   Retained products of conception following abortion 10/20/2022   Encounter for contraceptive management 10/20/2022   Constipation 04/02/2022   Postpartum care and examination 04/02/2022   Postpartum anemia 03/14/2022   Blood pressure check 03/05/2022   Anemia 03/05/2022   Gestational thrombocytopenia ([place]) 03/03/2022   Gestational hypertension 02/28/2022   Gestational hypertension affecting first pregnancy 02/21/2022   Supervision of normal first pregnancy 09/04/2021   Allergic rhinitis 05/01/2012   ECZEMA, ATOPIC DERMATITIS 11/20/2006    OBJECTIVE:   BP 118/75   Pulse 87   Temp 98.6 F (37 C)   Ht '5\' 5"'$  (1.651 m)   Wt 150 lb 12.8 oz (68.4 kg)   SpO2 100%   BMI 25.09 kg/m    PHQ-9:     03/29/2022   11:05 AM 02/21/2022   11:32 AM 02/08/2022   10:43 AM  Depression screen PHQ 2/9  Decreased Interest 0 0  0  Down, Depressed, Hopeless 0 0 0  PHQ - 2 Score 0 0 0  Altered sleeping 0 0 0  Tired, decreased energy 0 0 0  Change in appetite 0 0 0  Feeling bad or failure about yourself  0 0 0  Trouble concentrating 0 0 0  Moving slowly or fidgety/restless 0 0 0  Suicidal thoughts 0 0 0  PHQ-9 Score 0 0 0  Difficult doing work/chores   Not difficult at all    Physical Exam General: Awake, alert, oriented, no acute distress Respiratory: Unlabored respirations, speaking in full sentences, no respiratory distress Extremities: Moving all extremities spontaneously  ASSESSMENT/PLAN:   Retained products of conception following abortion Patient reports feeling back to normal. VSS, physical exam unremarkable. No signs of systemic illness. No signs of uterine infection. She does not believe she has passed any tissue. Discussed keeping her follow up with OBGYN, where she will likely have another ultrasound to confirm expulsion of tissue. Return and ED precautions discussed.   Encounter for contraceptive management Rx sprintec for contraception. Discussed starting once her period returns.   Anemia Patient to restart iron supplementation. Plan to recheck CBC in one month, future order placed.      Ezequiel Essex, MD [place]

## 2022-10-18 NOTE — Patient Instructions (Addendum)
It was wonderful to see you today. Thank you for allowing me to be a part of your care. Below is a short summary of what we discussed at your visit today:  Abnormal uterine bleeding You look good today! No signs of infection or problem.  Keep your appointment with the OB/GYN.  Details in the next page.  Return for care if: - Fever and chills - Severe abdominal pain - You start bleeding more - Foul-smelling discharge  Birth control I sent in a prescription for Sprintec birth control pills. Take the first tablet in the pack on the first Sunday following the beginning of your menstrual period or on the first day of your period.  Please bring all of your medications to every appointment!  If you have any questions or concerns, please do not hesitate to contact us via phone or MyChart message.   Ezequiel Essex, MD

## 2022-10-20 DIAGNOSIS — Z309 Encounter for contraceptive management, unspecified: Secondary | ICD-10-CM | POA: Insufficient documentation

## 2022-10-20 DIAGNOSIS — O034 Incomplete spontaneous abortion without complication: Secondary | ICD-10-CM | POA: Insufficient documentation

## 2022-10-20 NOTE — Assessment & Plan Note (Signed)
Patient reports feeling back to normal. VSS, physical exam unremarkable. No signs of systemic illness. No signs of uterine infection. She does not believe she has passed any tissue. Discussed keeping her follow up with OBGYN, where she will likely have another ultrasound to confirm expulsion of tissue. Return and ED precautions discussed.

## 2022-10-20 NOTE — Assessment & Plan Note (Signed)
Patient to restart iron supplementation. Plan to recheck CBC in one month, future order placed.

## 2022-10-20 NOTE — Assessment & Plan Note (Signed)
Rx sprintec for contraception. Discussed starting once her period returns.

## 2022-10-22 ENCOUNTER — Other Ambulatory Visit (HOSPITAL_COMMUNITY): Payer: Self-pay

## 2022-10-24 ENCOUNTER — Other Ambulatory Visit (HOSPITAL_COMMUNITY)
Admission: RE | Admit: 2022-10-24 | Discharge: 2022-10-24 | Disposition: A | Discharge status: Home / Self Care | Payer: Medicaid Other | Source: Ambulatory Visit | Attending: Family Medicine | Admitting: Family Medicine

## 2022-10-24 ENCOUNTER — Ambulatory Visit (INDEPENDENT_AMBULATORY_CARE_PROVIDER_SITE_OTHER): Payer: Medicaid Other | Admitting: Family Medicine

## 2022-10-24 ENCOUNTER — Other Ambulatory Visit (HOSPITAL_COMMUNITY): Payer: Self-pay

## 2022-10-24 ENCOUNTER — Other Ambulatory Visit: Payer: Self-pay

## 2022-10-24 VITALS — BP 134/78 | HR 72 | Ht 65.0 in | Wt 152.9 lb

## 2022-10-24 DIAGNOSIS — Z3A00 Weeks of gestation of pregnancy not specified: Secondary | ICD-10-CM | POA: Diagnosis not present

## 2022-10-24 DIAGNOSIS — N898 Other specified noninflammatory disorders of vagina: Secondary | ICD-10-CM | POA: Diagnosis not present

## 2022-10-24 DIAGNOSIS — O034 Incomplete spontaneous abortion without complication: Secondary | ICD-10-CM

## 2022-10-24 NOTE — Progress Notes (Signed)
Pt reports pinkish discharge & lower abdominal pain, starting yesterday.

## 2022-10-24 NOTE — Progress Notes (Signed)
GYNECOLOGY OFFICE VISIT NOTE  History:   Sonya Harris is a 22 y.o. G2P1011 here today for f/u after incomplete abortion on 1/17. All started Dec 3rd. Was given cytotec, and had abd pain. Then bled, passed some tissue and continue to bleed until ~1/16. Then on ~1/17 she started bleeding more. She received more medication and did stop the bleeding, but she continued to have pink discharge.  She notes abd pain onset 1 day ago and pink d/c, painful sex. Has not take iron, or OCP pills. She has not has a period yet. She took a pregnancy test that was negative.  She denies any abnormal vaginal discharge, bleeding, pelvic pain or other concerns.    Past Medical History:  Diagnosis Date   Pregnancy induced hypertension    Seizures ([place])    22 yr old    Past Surgical History:  Procedure Laterality Date   SP ARTHRO THUMB*R*      Review of Systems:  Pertinent items noted in HPI and remainder of comprehensive ROS otherwise negative.  Physical Exam:  BP 134/78   Pulse 72   Ht '5\' 5"'$  (1.651 m)   Wt 152 lb 14.4 oz (69.4 kg)   BMI 25.44 kg/m  CONSTITUTIONAL: Well-developed, well-nourished female in no acute distress.  HEENT:  Normocephalic, atraumatic. External right and left ear normal. No scleral icterus.  NECK: Normal range of motion, supple, no masses noted on observation SKIN: No rash noted. Not diaphoretic. No erythema. No pallor. MUSCULOSKELETAL: Normal range of motion. No edema noted. NEUROLOGIC: Alert and oriented to person, place, and time. Normal muscle tone coordination.  PSYCHIATRIC: Normal mood and affect. Normal behavior. Normal judgment and thought content. CARDIOVASCULAR: Normal heart rate noted RESPIRATORY: Effort and breath sounds normal, no problems with respiration noted ABDOMEN: No masses noted. No other overt distention noted.   PELVIC: Normal appearing external genitalia; normal urethral meatus; normal appearing vaginal mucosa and cervix. White discharge noted.   Normal uterine size, no other palpable masses, no uterine or adnexal tenderness. Performed in the presence of a chaperone  Labs and Imaging No results found for this or any previous visit (from the past 168 hour(s)). US PELVIC COMPLETE WITH TRANSVAGINAL  Result Date: 10/09/2022 CLINICAL DATA:  Vaginal bleeding EXAM: TRANSABDOMINAL AND TRANSVAGINAL ULTRASOUND OF PELVIS TECHNIQUE: Both transabdominal and transvaginal ultrasound examinations of the pelvis were performed. Transabdominal technique was performed for global imaging of the pelvis including uterus, ovaries, adnexal regions, and pelvic cul-de-sac. It was necessary to proceed with endovaginal exam following the transabdominal exam to visualize the uterus, endometrium, ovaries and adnexa. COMPARISON:  None available FINDINGS: Uterus Measurements: 8.6 x 4.4 x 5.4 cm = volume: 106 mL. 1.2 cm anterior fundal fibroid, subserosal. Endometrium Thickness: 12 mm in thickness. Highly vascular area within the endometrium and fundus concerning for retained products of conception. Right ovary Measurements: 3.1 x 2.0 x 2.1 cm = volume: 6.6 mL. Normal appearance/no adnexal mass. Left ovary Measurements: 2.4 x 2.1 x 2.0 cm = volume: 5.4 mL. Normal appearance/no adnexal mass. Other findings Trace free fluid in the pelvis. IMPRESSION: No intrauterine pregnancy. Hypervascular endometrium in the fundus concerning for retained products of conception. Electronically Signed   By: Rolm Baptise M.D.   On: 10/09/2022 19:51      Assessment and Plan:      1. Retained products of conception following abortion - Beta hCG quant (ref lab)  2. Vaginal discharge - Cervicovaginal ancillary only( [place])   S/p 2 rounds of  medical management, last one being misoprostol 829mg BU. Today she notes Abd pain, pink discharge.  Plan to get bHCG. Vagianl swab collected given dyspareunia and white vaginal discharge. Pt would like POPs once she knows she is not pregnant.  Routine  preventative health maintenance measures emphasized. Please refer to After Visit Summary for other counseling recommendations.   JShelda Pal DO OB Fellow, [place]2/09/2022 2:27 PM

## 2022-10-25 LAB — CERVICOVAGINAL ANCILLARY ONLY
Bacterial Vaginitis (gardnerella): POSITIVE — AB
Candida Glabrata: NEGATIVE
Candida Vaginitis: NEGATIVE
Chlamydia: NEGATIVE
Comment: NEGATIVE
Comment: NEGATIVE
Comment: NEGATIVE
Comment: NEGATIVE
Comment: NEGATIVE
Comment: NORMAL
Neisseria Gonorrhea: NEGATIVE
Trichomonas: NEGATIVE

## 2022-10-25 LAB — BETA HCG QUANT (REF LAB): hCG Quant: 2 m[IU]/mL

## 2022-10-26 ENCOUNTER — Other Ambulatory Visit: Payer: Self-pay | Admitting: Family Medicine

## 2022-10-26 DIAGNOSIS — N760 Acute vaginitis: Secondary | ICD-10-CM

## 2022-10-26 MED ORDER — METRONIDAZOLE 500 MG PO TABS
500.0000 mg | ORAL_TABLET | Freq: Two times a day (BID) | ORAL | 0 refills | Status: AC
  Filled 2022-10-26 – 2022-11-07 (×2): qty 14, 7d supply, fill #0

## 2022-10-28 ENCOUNTER — Other Ambulatory Visit (HOSPITAL_COMMUNITY): Payer: Self-pay

## 2022-10-28 ENCOUNTER — Other Ambulatory Visit: Payer: Self-pay

## 2022-10-30 ENCOUNTER — Other Ambulatory Visit (HOSPITAL_COMMUNITY): Payer: Self-pay

## 2022-11-07 ENCOUNTER — Other Ambulatory Visit (HOSPITAL_COMMUNITY): Payer: Self-pay

## 2022-11-27 ENCOUNTER — Other Ambulatory Visit (HOSPITAL_COMMUNITY): Payer: Self-pay

## 2022-12-28 ENCOUNTER — Ambulatory Visit (HOSPITAL_COMMUNITY)
Admission: EM | Admit: 2022-12-28 | Discharge: 2022-12-28 | Disposition: A | Discharge status: Home / Self Care | Payer: Medicaid Other | Attending: Physician Assistant | Admitting: Physician Assistant

## 2022-12-28 ENCOUNTER — Ambulatory Visit (INDEPENDENT_AMBULATORY_CARE_PROVIDER_SITE_OTHER): Payer: Medicaid Other

## 2022-12-28 ENCOUNTER — Encounter (HOSPITAL_COMMUNITY): Payer: Self-pay

## 2022-12-28 DIAGNOSIS — M79641 Pain in right hand: Secondary | ICD-10-CM

## 2022-12-28 DIAGNOSIS — S6991XA Unspecified injury of right wrist, hand and finger(s), initial encounter: Secondary | ICD-10-CM | POA: Diagnosis not present

## 2022-12-28 DIAGNOSIS — S60221A Contusion of right hand, initial encounter: Secondary | ICD-10-CM | POA: Diagnosis not present

## 2022-12-28 NOTE — ED Triage Notes (Signed)
Pt presents to the office for right finger pain and swelling. Pt stated her fingers were smash in the  car door last night.
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
## 2022-12-28 NOTE — Discharge Instructions (Addendum)
Advised to continue taking ibuprofen 600 mg every 6-8 hours with food to help reduce pain and swelling. Advised to use Epsom salt soaks, 10 minutes, 3-4 times throughout the day to help reduce pain and swelling. Advised to use ice therapy, 5 minutes on 20 minutes off, 3-4 times throughout the evening to help reduce pain and discomfort.  Advised follow-up PCP return to urgent care as needed.

## 2023-01-08 ENCOUNTER — Other Ambulatory Visit (HOSPITAL_COMMUNITY): Payer: Self-pay

## 2023-01-22 ENCOUNTER — Ambulatory Visit (HOSPITAL_COMMUNITY)
Admission: EM | Admit: 2023-01-22 | Discharge: 2023-01-22 | Disposition: A | Discharge status: Home / Self Care | Payer: Medicaid Other | Attending: Family Medicine | Admitting: Family Medicine

## 2023-01-22 ENCOUNTER — Encounter (HOSPITAL_COMMUNITY): Payer: Self-pay

## 2023-01-22 DIAGNOSIS — J029 Acute pharyngitis, unspecified: Secondary | ICD-10-CM | POA: Diagnosis not present

## 2023-01-22 LAB — POCT RAPID STREP A (OFFICE): Rapid Strep A Screen: NEGATIVE

## 2023-01-22 MED ORDER — LIDOCAINE VISCOUS HCL 2 % MT SOLN: 5.0000 mL | Freq: Four times a day (QID) | OROMUCOSAL | 0 refills | Status: DC | PRN

## 2023-01-22 MED ORDER — AMOXICILLIN 875 MG PO TABS: 875.0000 mg | ORAL_TABLET | Freq: Two times a day (BID) | ORAL | 0 refills | Status: AC

## 2023-01-22 NOTE — Discharge Instructions (Signed)
You may use over the counter ibuprofen or acetaminophen as needed.  For a sore throat, over the counter products such as [employer] Peroxyl Mouth Sore Rinse or Chloraseptic Sore Throat Spray may provide some temporary relief. Your rapid strep test was negative today. We have sent your throat swab for culture and will let you know of any results that affect your treatment.

## 2023-01-22 NOTE — ED Triage Notes (Signed)
Pt c/o fever, headache, sore throat, neck pain, and weakness since yesterday. States took tylenol 650mg  yesterday with no relief.

## 2023-01-25 LAB — CULTURE, GROUP A STREP (THRC)

## 2023-01-28 ENCOUNTER — Telehealth: Payer: Self-pay

## 2023-01-28 NOTE — Telephone Encounter (Signed)
Patient calls nurse line in regards to birth control pills.  She reports she missed "maybe one or two" ~1.5 weeks ago. She reports she lost her pill pack which made her miss doses. She reports she has been taking her BC consistently since she started a new pack.   She reports she missed her period. She reports she was supposed to start yesterday. She reports a negative home pregnancy test last week.   Patient advised missing a few pills could cause AUB. Patient advised to take another pregnancy test in the next week. Patient to call with result if positive.   Patient advised to use back up birth control until one full pack has been taken.   Patient agreed with plan.

## 2023-01-30 ENCOUNTER — Other Ambulatory Visit (HOSPITAL_COMMUNITY): Payer: Self-pay

## 2023-01-30 NOTE — ED Provider Notes (Signed)
[facility]   [id number] 01/22/23 Arrival Time: 1836  ASSESSMENT & PLAN:  1. Acute pharyngitis, unspecified etiology    No signs of peritonsillar abscess. Rapid strep negative but suspicious for strep. Will tx empirically.  Meds ordered this encounter  Medications   amoxicillin (AMOXIL) 875 MG tablet    Sig: Take 1 tablet (875 mg total) by mouth 2 (two) times daily for 10 days.    Dispense:  20 tablet    Refill:  0   magic mouthwash (lidocaine, diphenhydrAMINE, alum & mag hydroxide) suspension    Sig: Swish and spit 5 mLs 4 (four) times daily as needed for mouth pain.    Dispense:  360 mL    Refill:  0    Labs Reviewed  CULTURE, GROUP A STREP [facility])  POCT RAPID STREP A (OFFICE)      Discharge Instructions      You may use over the counter ibuprofen or acetaminophen as needed.  For a sore throat, over the counter products such as [employer] Peroxyl Mouth Sore Rinse or Chloraseptic Sore Throat Spray may provide some temporary relief. Your rapid strep test was negative today. We have sent your throat swab for culture and will let you know of any results that affect your treatment.   Reviewed expectations re: course of current medical issues. Questions answered. Outlined signs and symptoms indicating need for more acute intervention. Patient verbalized understanding. After Visit Summary given.   SUBJECTIVE:  Sonya Harris is a 22 y.o. female who reports a sore throat. Pt c/o fever, headache, sore throat, neck pain, and weakness since yesterday. States took tylenol 650mg  yesterday with no relief.    OBJECTIVE:  Vitals:   01/22/23 1851  BP: 119/74  Pulse: (!) 108  Resp: 18  Temp: 100.1 F (37.8 C)  TempSrc: Oral  SpO2: 98%    Slight tachycardia noted General appearance: alert; no distress HEENT: throat with moderate erythema and with exudative tonsillar hypertrophy; uvula is midline Neck: supple with FROM; cervical LAD bilat Lungs: speaks full  sentences without difficulty; unlabored Abd: soft; non-tender Skin: reveals no rash; warm and dry Psychological: alert and cooperative; normal mood and affect  No Known Allergies  Past Medical History:  Diagnosis Date   Pregnancy induced hypertension    Seizures (HCC)    22 yr old   Social History   Socioeconomic History   Marital status: Single    Spouse name: Not on file   Number of children: Not on file   Years of education: Not on file   Highest education level: Not on file  Occupational History   Not on file  Tobacco Use   Smoking status: Never    Passive exposure: Yes   Smokeless tobacco: Never  Vaping Use   Vaping Use: Never used  Substance and Sexual Activity   Alcohol use: Not Currently    Comment: not while preg   Drug use: Never   Sexual activity: Yes    Birth control/protection: Pill  Other Topics Concern   Not on file  Social History Narrative   Not on file   Social Determinants of Health   Financial Resource Strain: Not on file  Food Insecurity: Not on file  Transportation Needs: Not on file  Physical Activity: Not on file  Stress: Not on file  Social Connections: Not on file  Intimate Partner Violence: Not on file   Family History  Problem Relation Age of Onset   Hypertension Mother  Hypertension Maternal Renato Gails, MD 01/30/23 1007

## 2023-02-12 IMAGING — US US MFM OB FOLLOW-UP
1 series · 16 of 28 positions shown · non-contrast
Comparison: none

[Series 1: us mfm ob follow-up · 88 acquisitions, 16 frames shown]
[im 1/88]
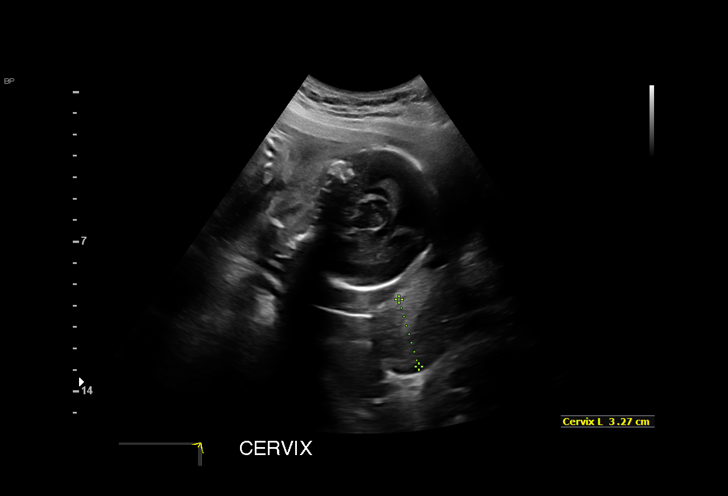
[im 7/88]
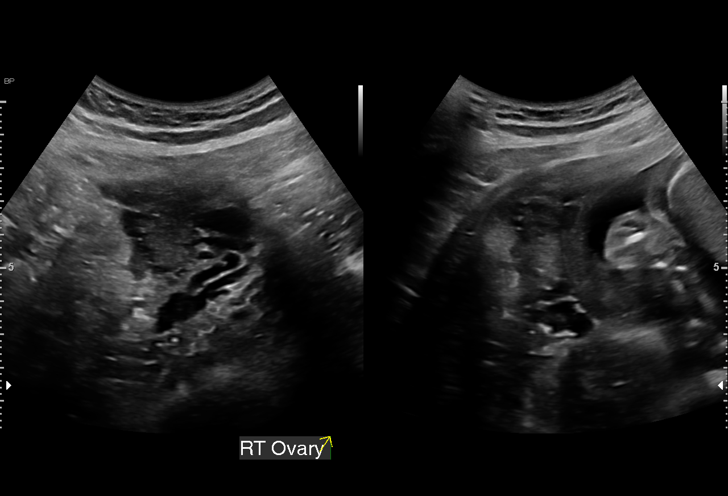
[im 13/88]
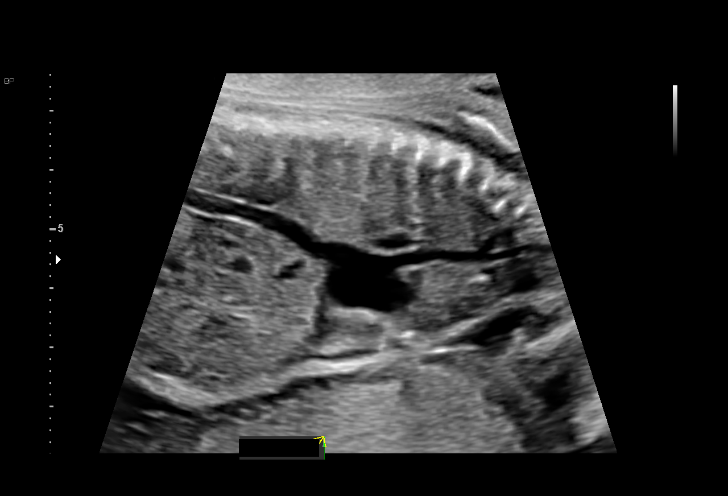
[im 20/88]
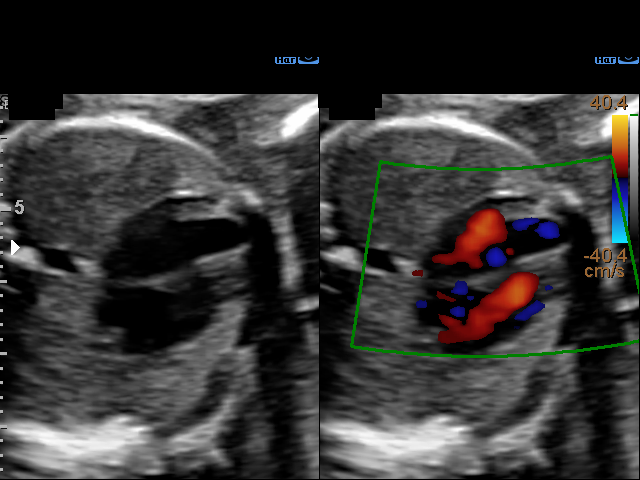
[im 23/88]
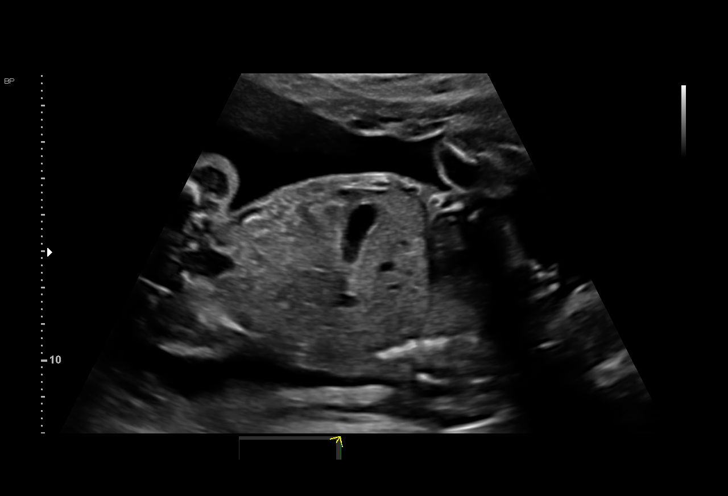
[im 30/88]
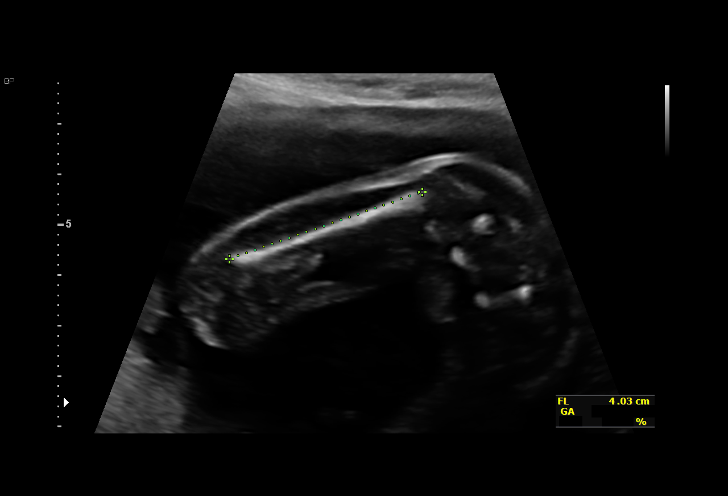
[im 36/88]
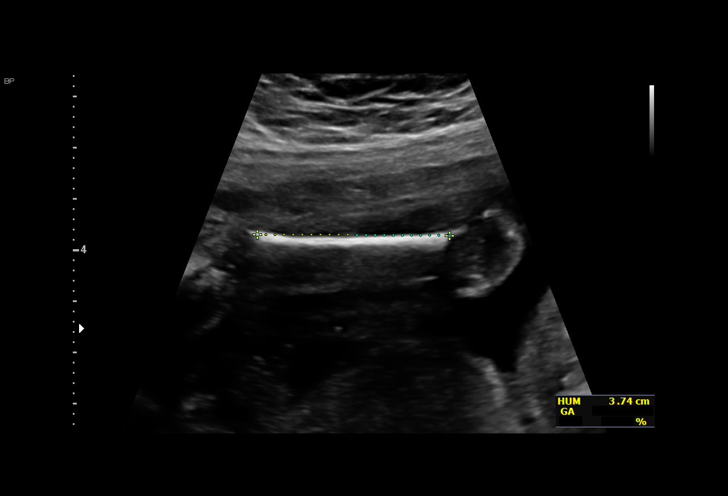
[im 42/88]
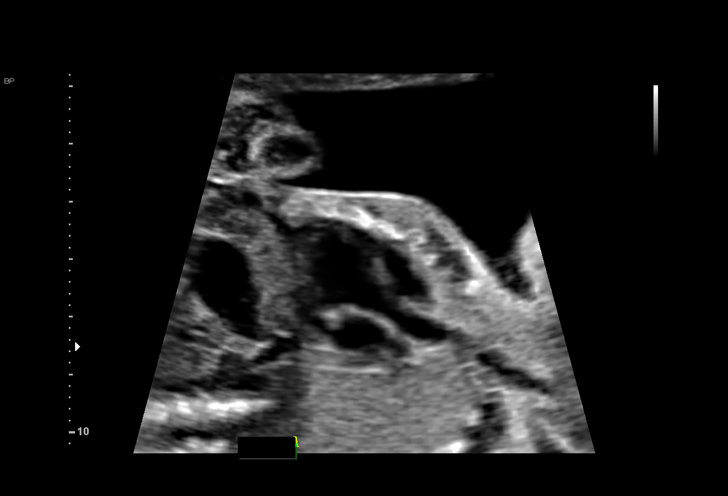
[im 46/88]
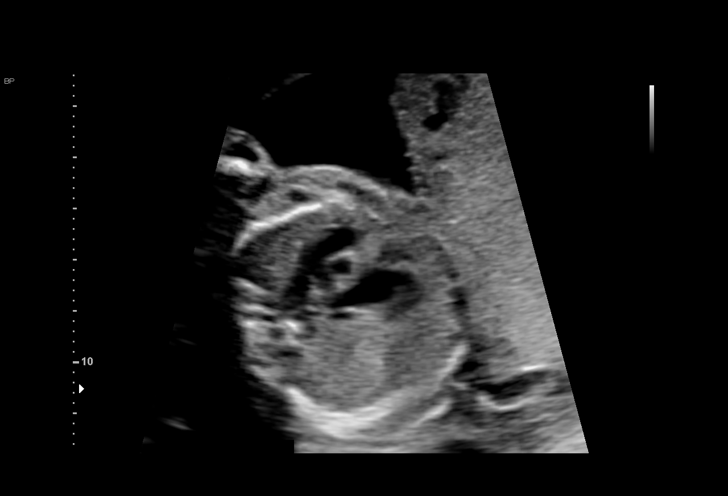
[im 52/88]
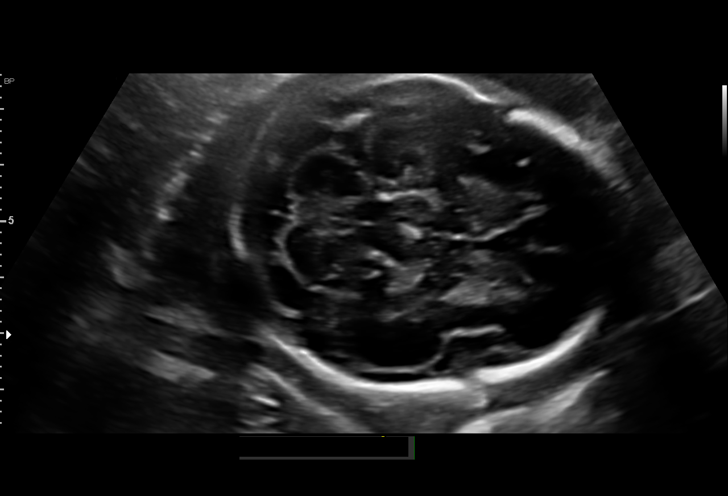
[im 59/88]
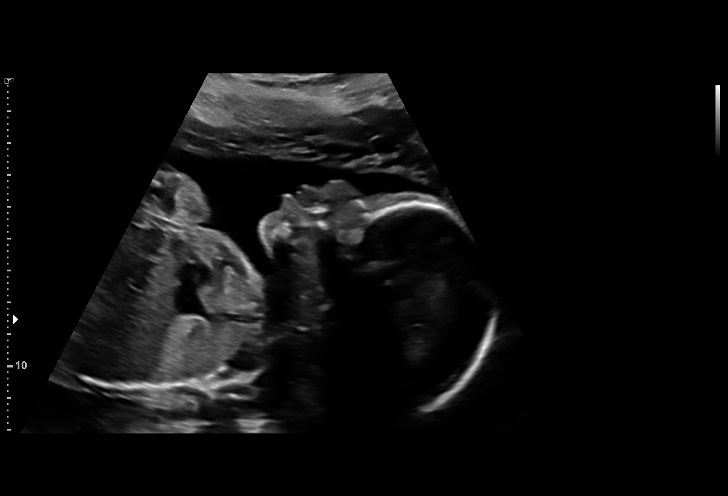
[im 65/88]
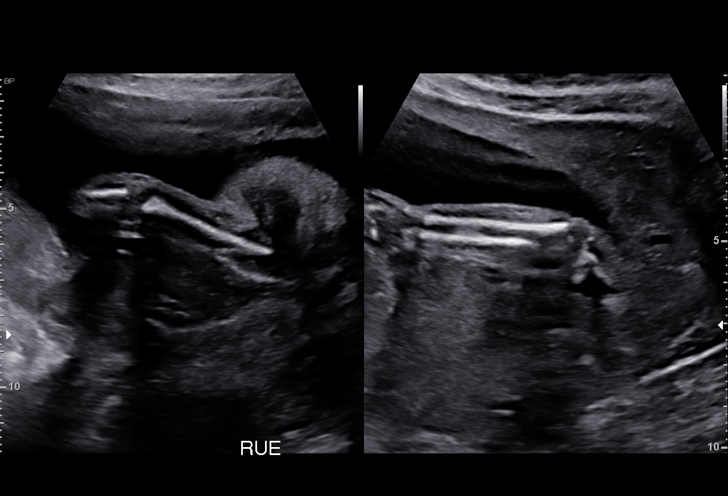
[im 68/88]
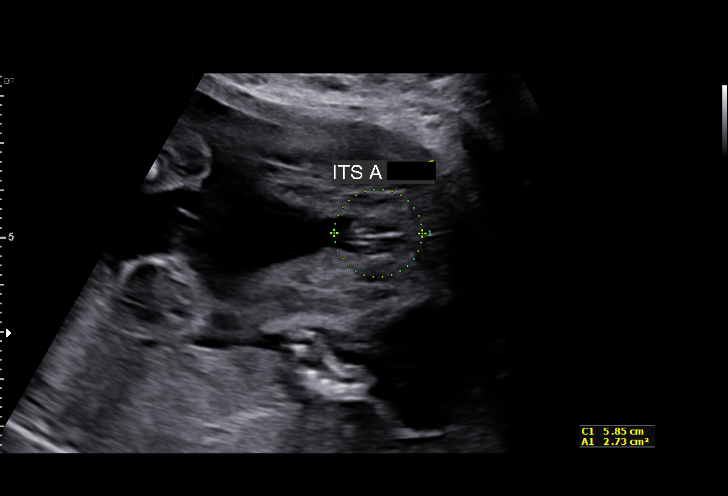
[im 75/88]
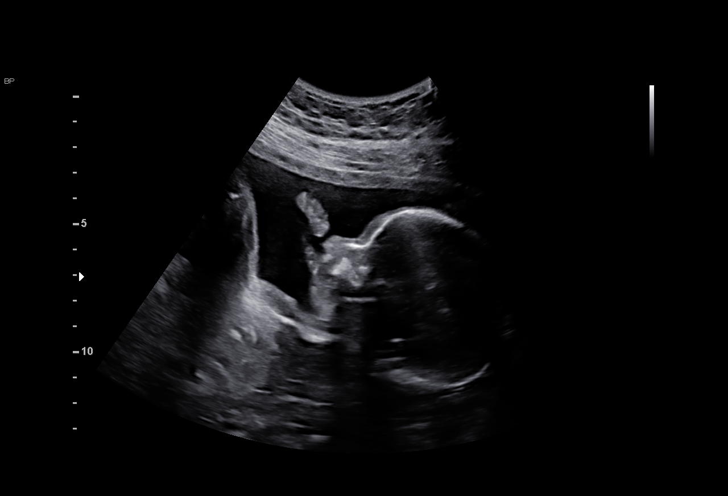
[im 81/88]
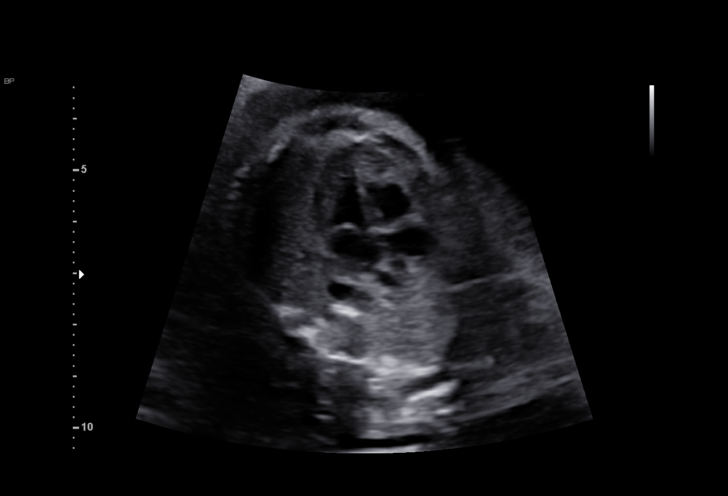
[im 88/88]
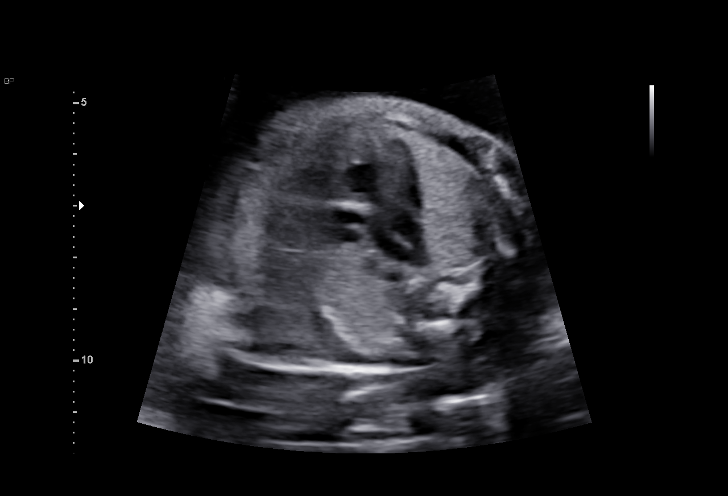

[16 of 28 positions shown; findings below may reference images not displayed]

[REDACTED]

Indications

 Uterine fibroids affecting pregnancy in        O34.12,
 second trimester, antepartum
 Low Risk NIPS

## 2023-02-21 ENCOUNTER — Other Ambulatory Visit (HOSPITAL_COMMUNITY): Payer: Self-pay

## 2023-03-12 IMAGING — US US MFM OB FOLLOW-UP
1 series · 16 of 28 positions shown · non-contrast
Comparison: none

[Series 1: us mfm ob follow-up · 16 of 53 slices shown]
[im 1/53]
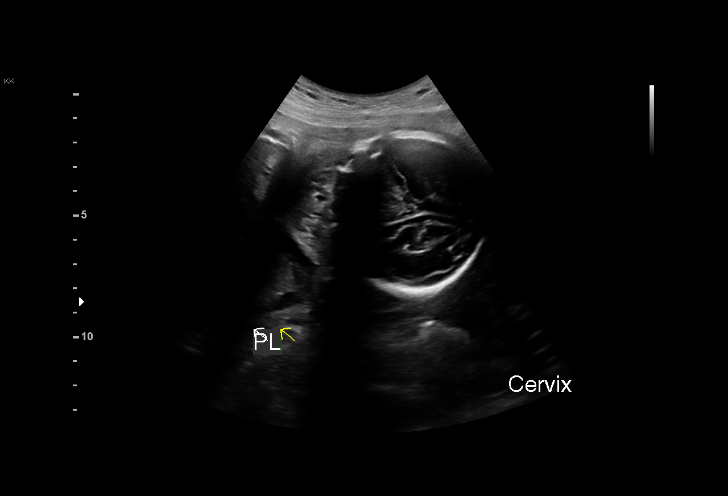
[im 4/53]
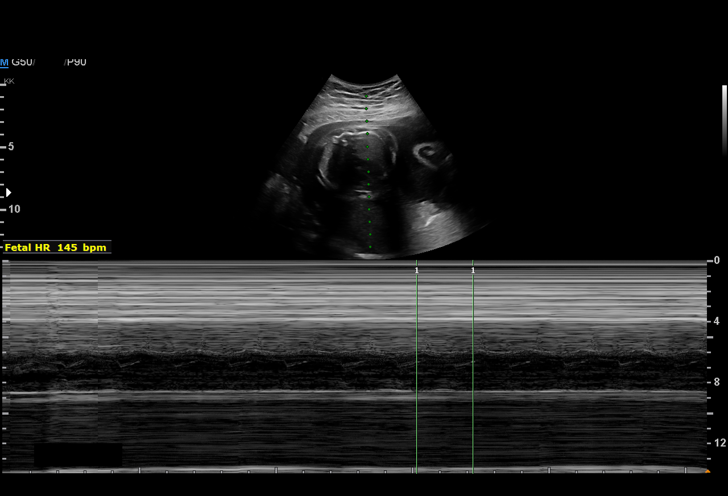
[im 8/53]
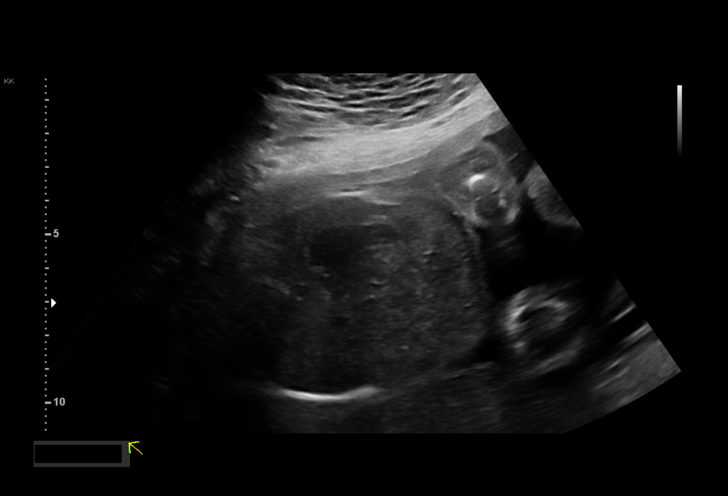
[im 12/53]
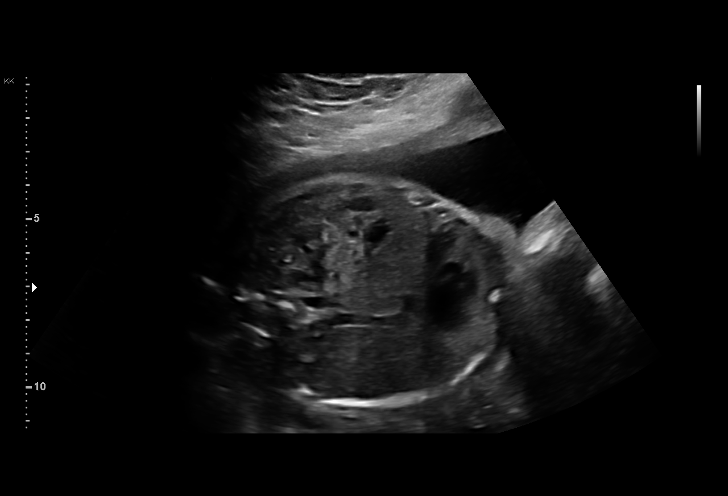
[im 14/53]
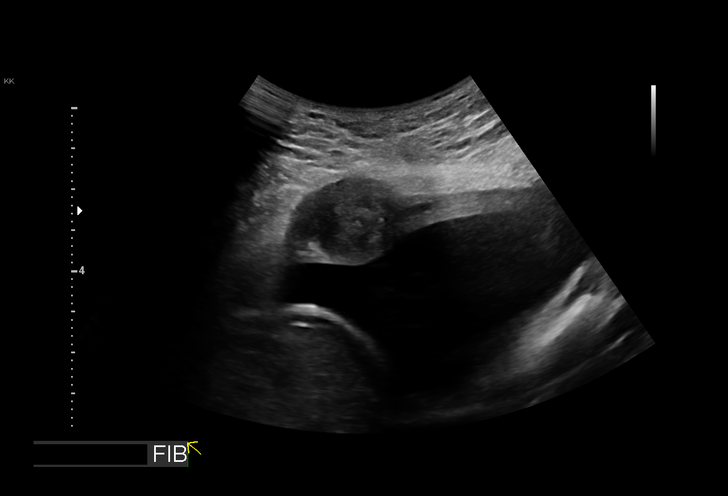
[im 18/53]
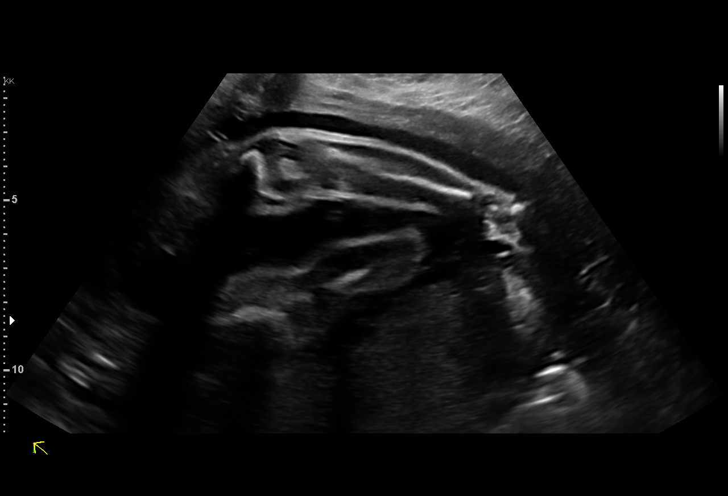
[im 22/53]
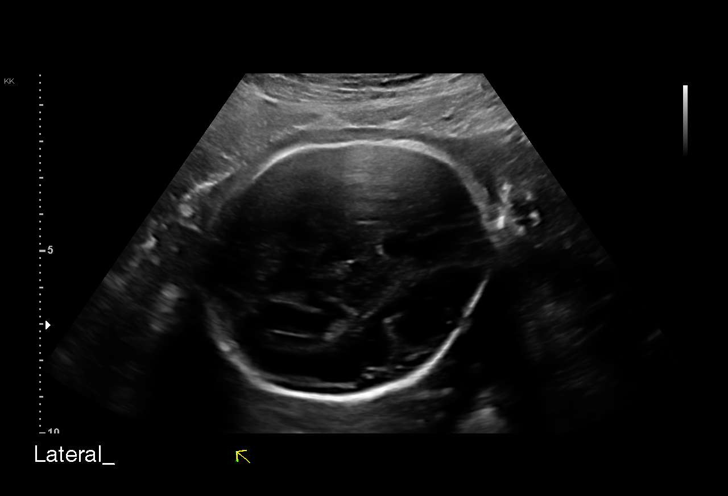
[im 26/53]
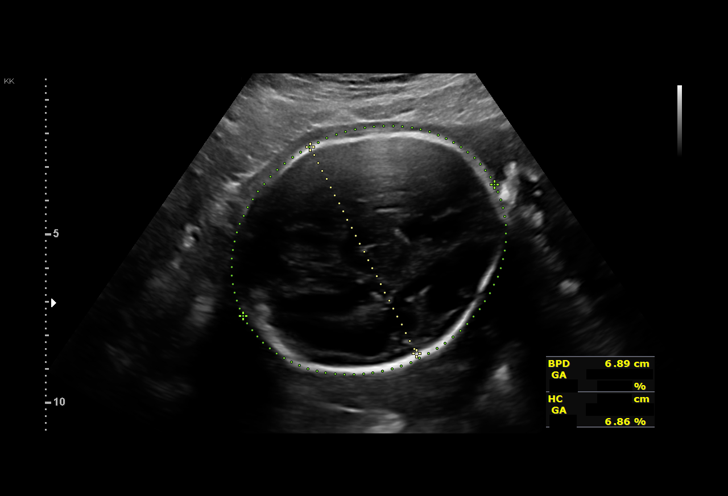
[im 27/53]
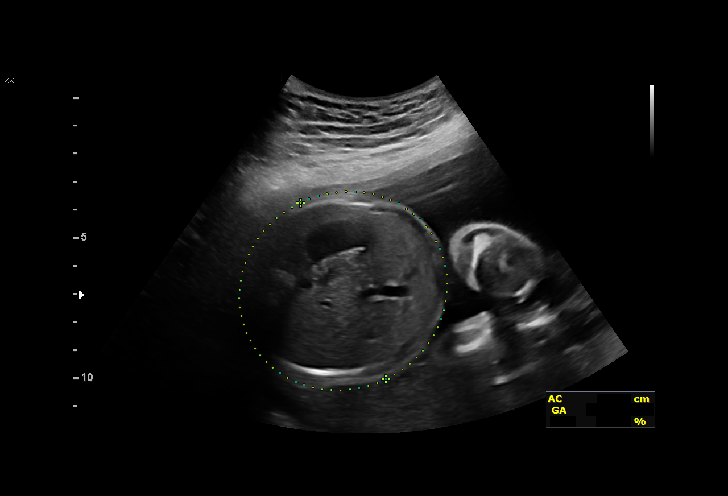
[im 31/53]
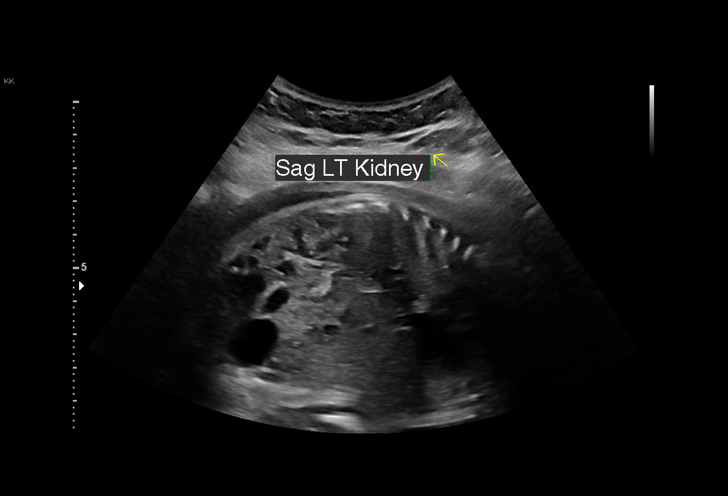
[im 35/53]
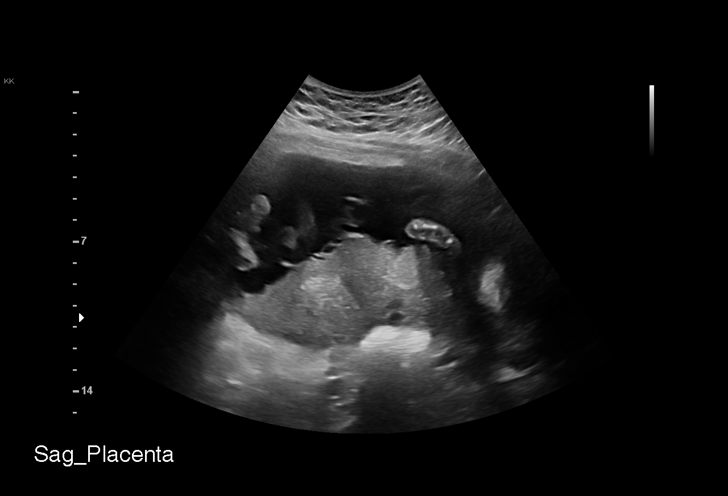
[im 39/53]
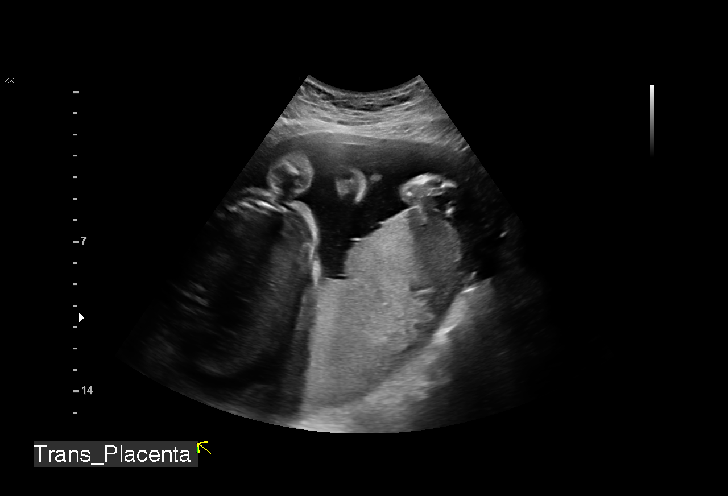
[im 41/53]
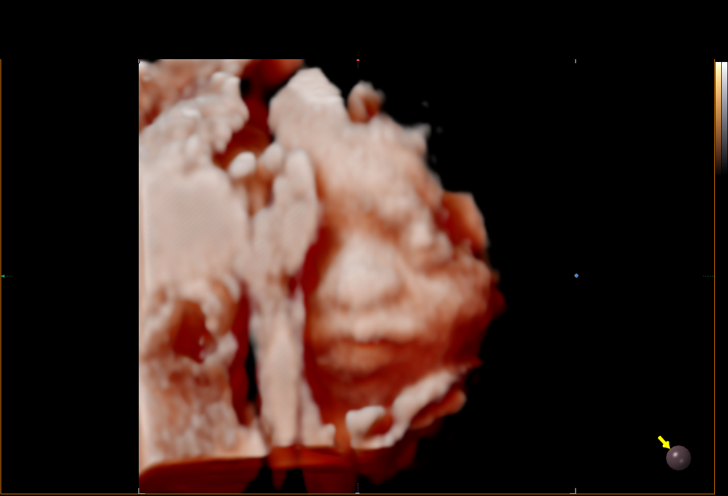
[im 45/53]
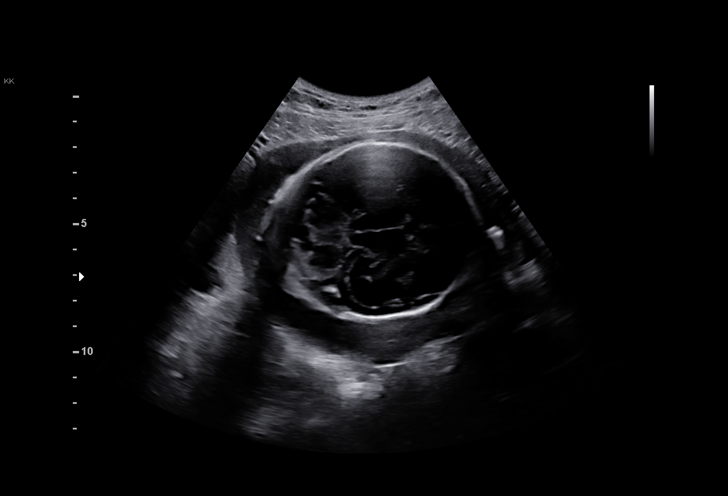
[im 49/53]
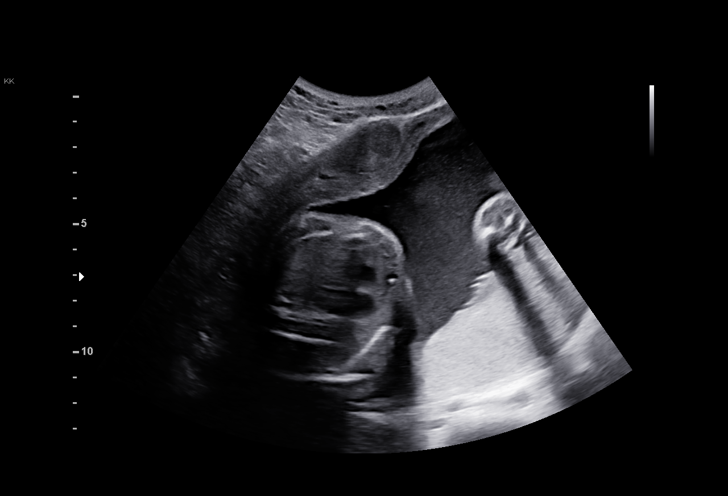
[im 53/53]
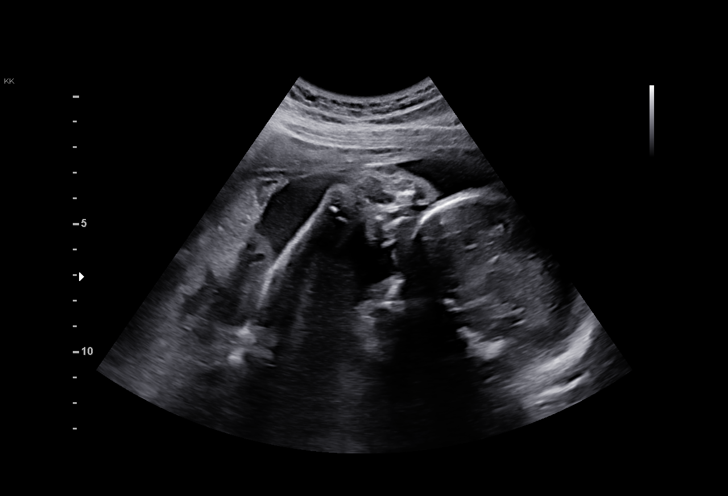

[16 of 28 positions shown; findings below may reference images not displayed]

[REDACTED]

Indications

 Uterine fibroids affecting pregnancy in        O34.12,
 second trimester, antepartum
 27 weeks gestation of pregnancy
 Low Risk NIPS

## 2023-03-17 ENCOUNTER — Other Ambulatory Visit (HOSPITAL_COMMUNITY): Payer: Self-pay

## 2023-04-07 ENCOUNTER — Other Ambulatory Visit (HOSPITAL_COMMUNITY): Payer: Self-pay

## 2023-04-16 ENCOUNTER — Other Ambulatory Visit (HOSPITAL_COMMUNITY): Payer: Self-pay

## 2023-04-17 ENCOUNTER — Other Ambulatory Visit (HOSPITAL_COMMUNITY): Payer: Self-pay

## 2023-04-29 ENCOUNTER — Other Ambulatory Visit (HOSPITAL_COMMUNITY): Payer: Self-pay

## 2023-05-13 ENCOUNTER — Encounter: Payer: Medicaid Other | Admitting: Student

## 2023-05-13 IMAGING — US US MFM FETAL BPP W/O NON-STRESS
1 series · 15 of 27 positions shown · non-contrast
Comparison: none

[Series 1: us mfm fetal bpp w/o non-stress · 27 acquisitions, 15 frames shown]
[im 1/27]
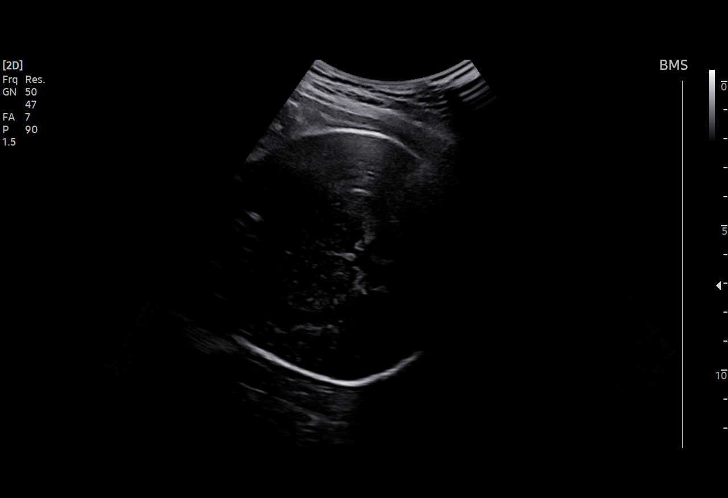
[im 3/27]
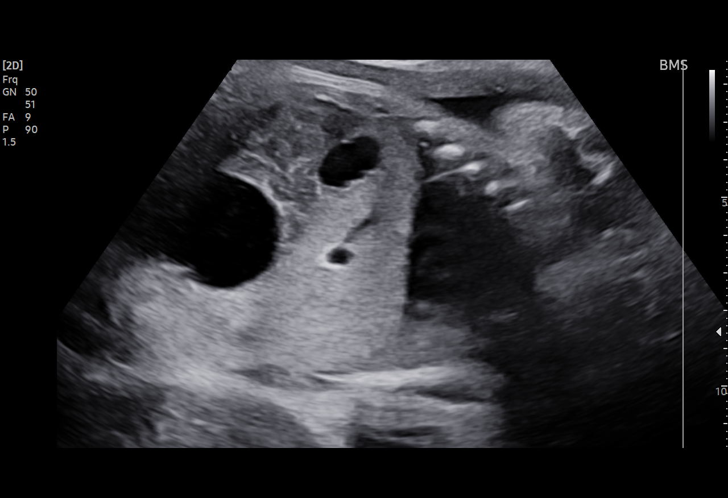
[im 5/27]
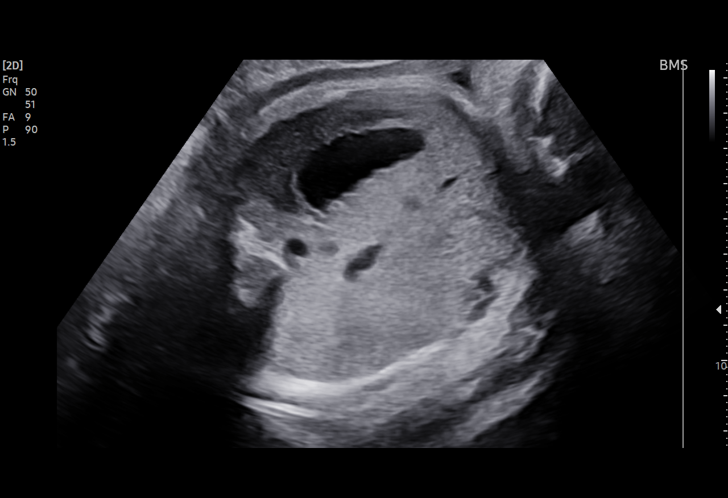
[im 7/27]
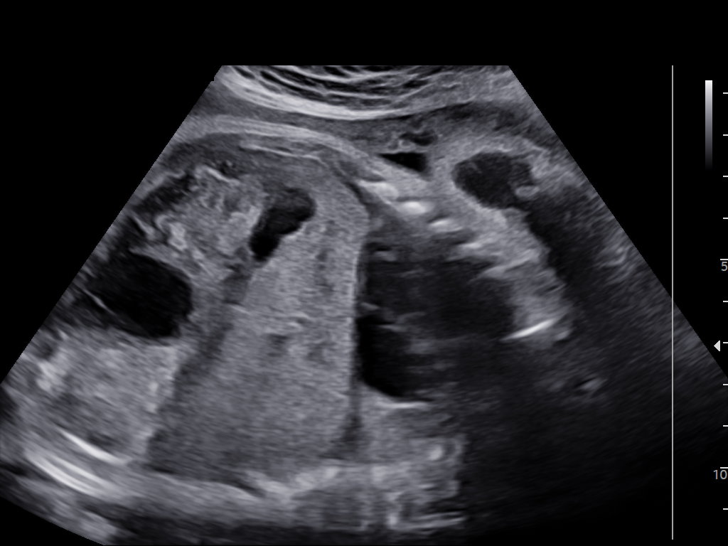
[im 9/27]
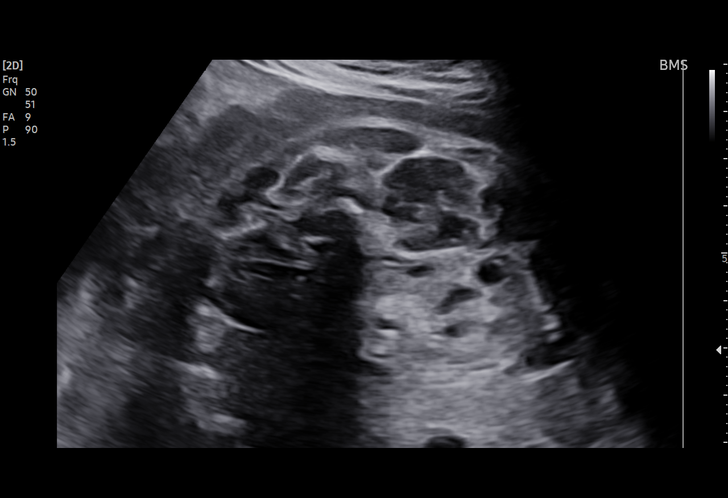
[im 10/27]
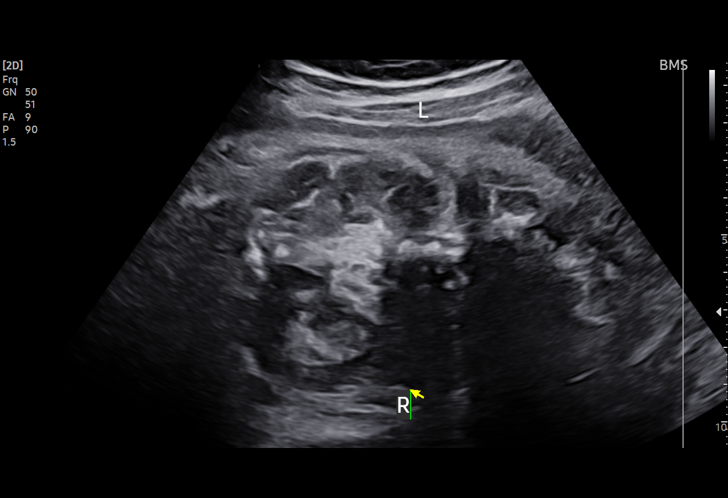
[im 12/27]
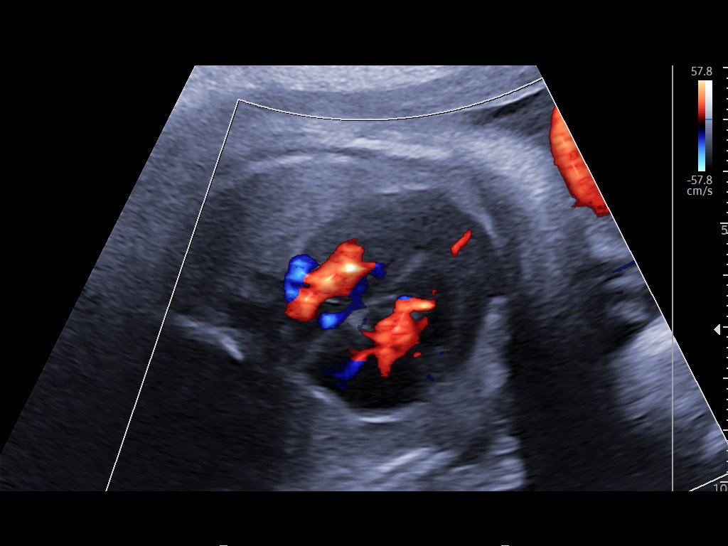
[im 14/27]
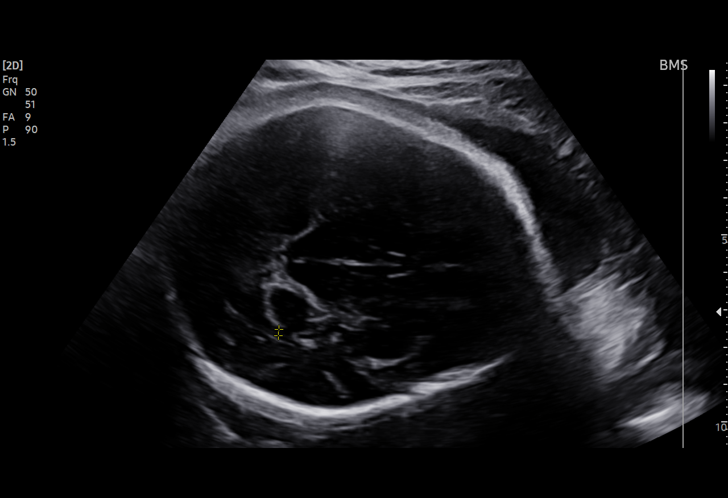
[im 16/27]
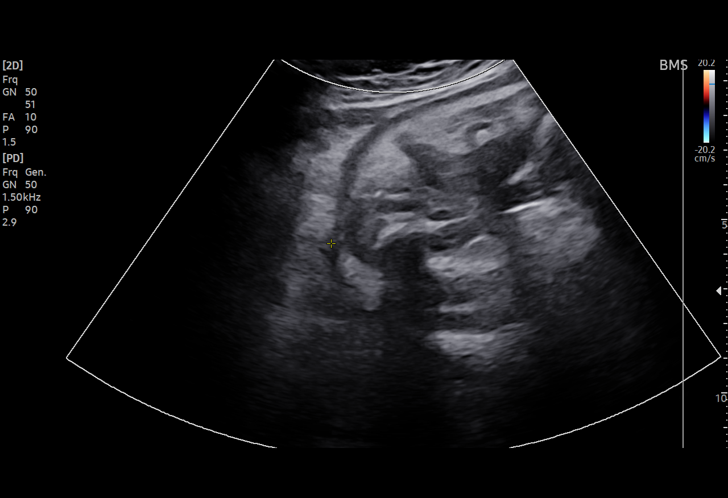
[im 18/27]
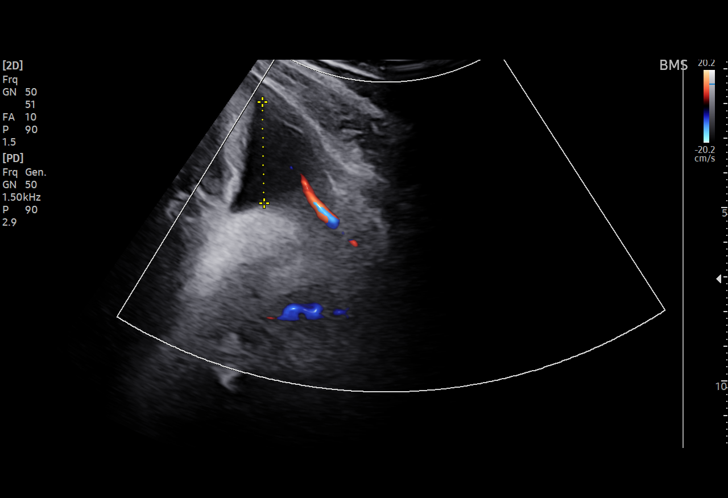
[im 19/27]
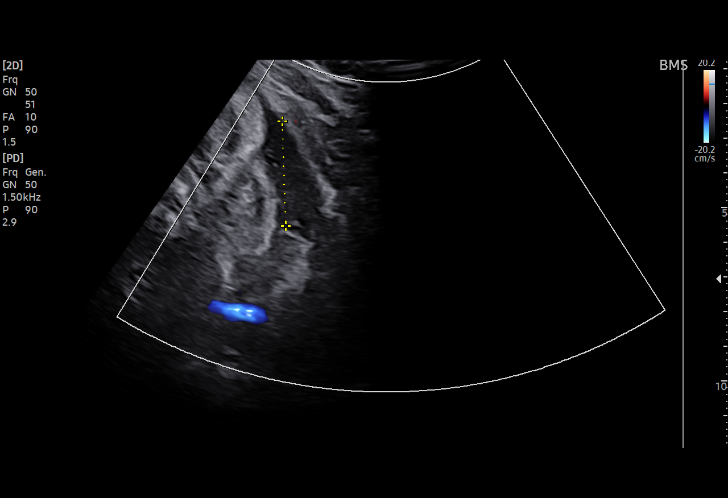
[im 21/27]
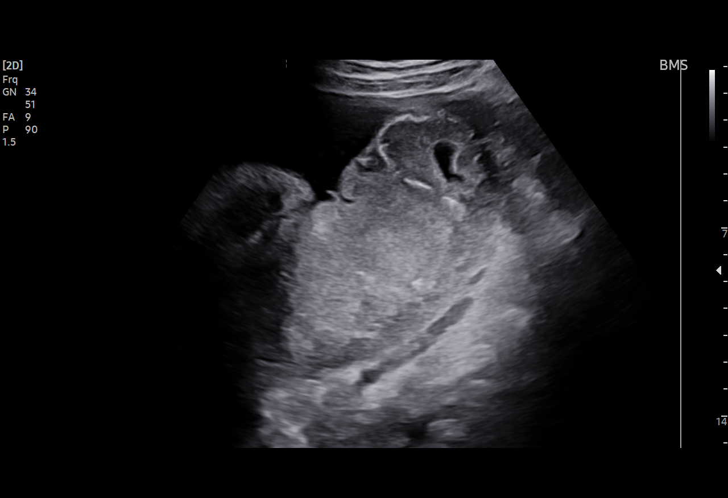
[im 23/27]
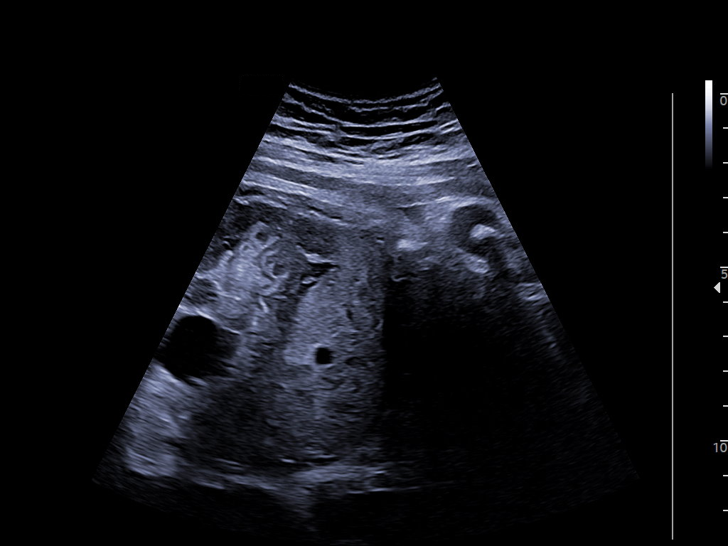
[im 25/27]
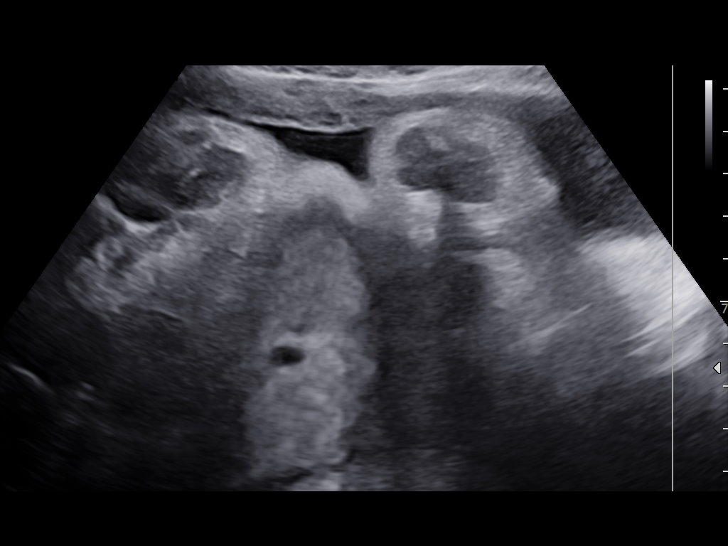
[im 27/27]
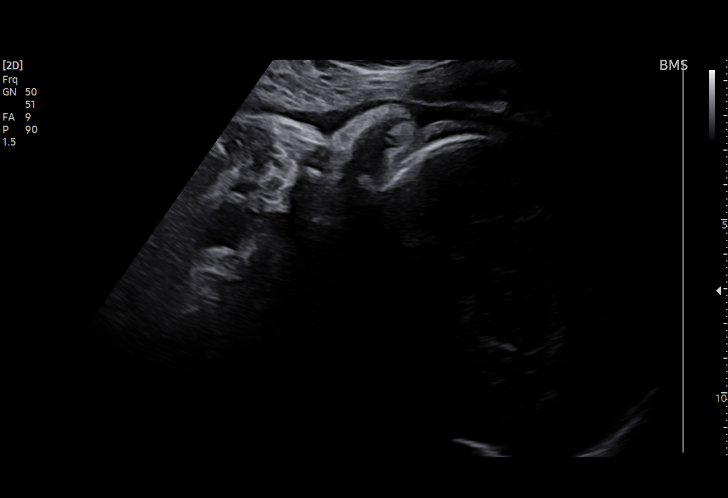

[15 of 27 positions shown; findings below may reference images not displayed]

[REDACTED]

Indications

 Gestational hypertension without significant
 proteinuria, third trimester
 Low Risk NIPS

## 2023-05-17 IMAGING — US US MFM FETAL BPP W/O NON-STRESS
1 series · 15 of 28 positions shown · non-contrast
Comparison: none

[Series 1: us mfm fetal bpp w/o non-stress · 34 acquisitions, 15 frames shown]
[im 1/34]
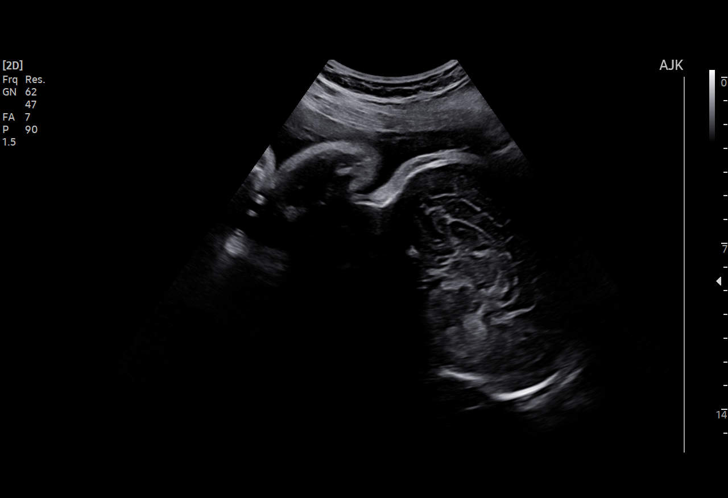
[im 3/34]
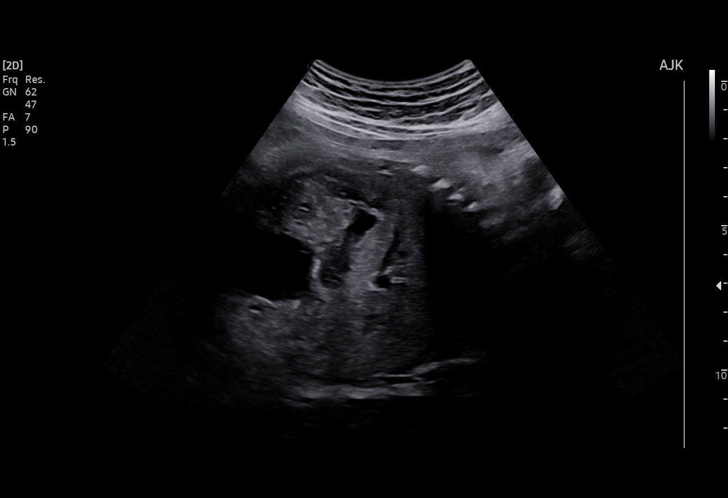
[im 5/34]
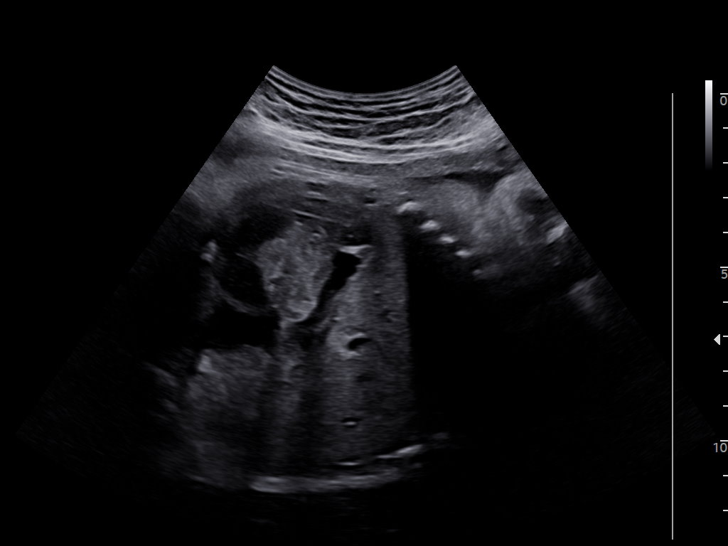
[im 8/34]
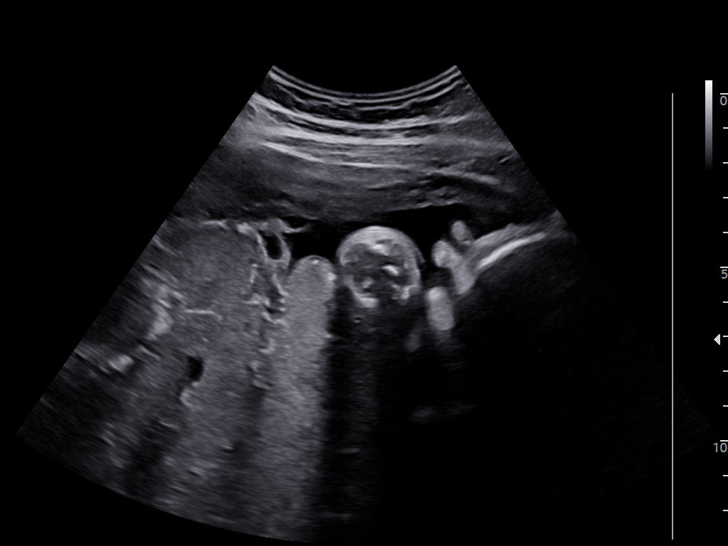
[im 10/34]
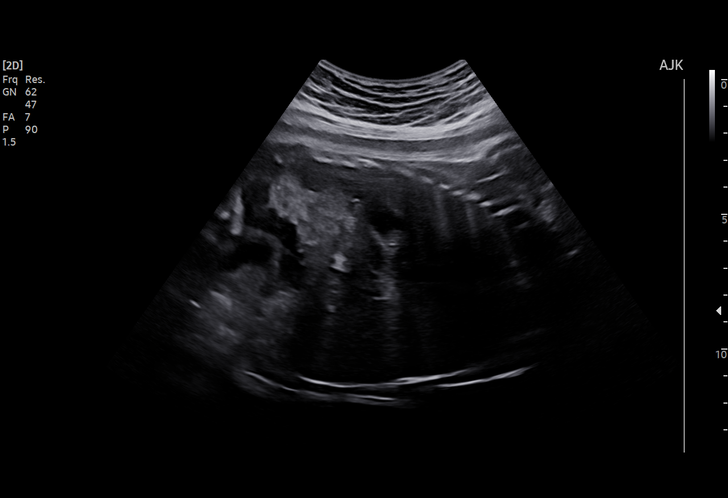
[im 13/34]
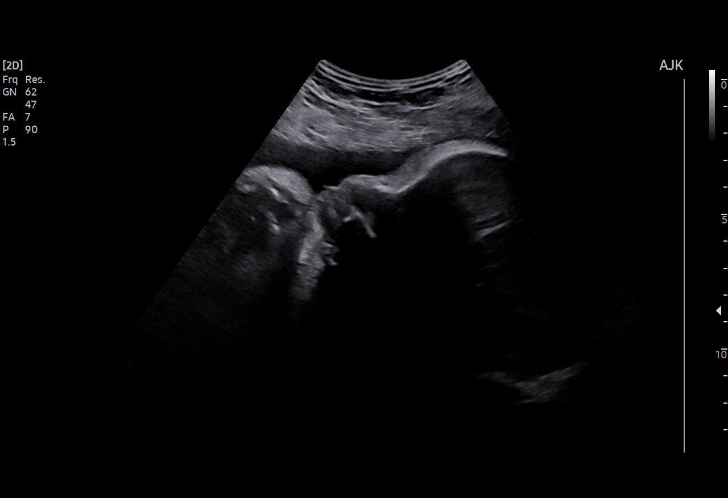
[im 15/34]
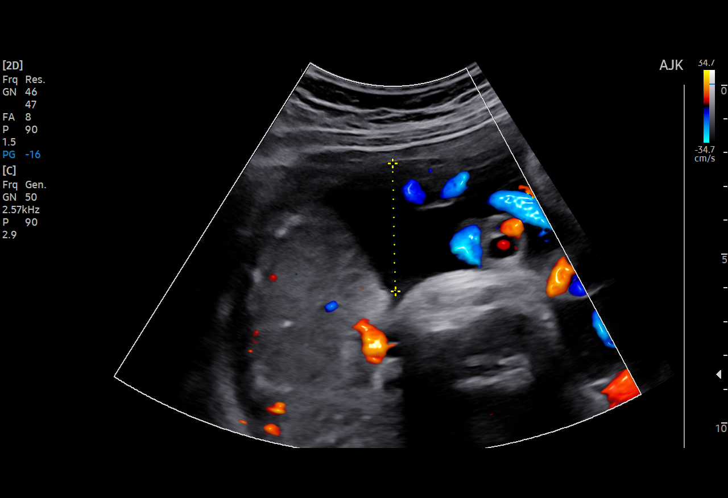
[im 18/34]
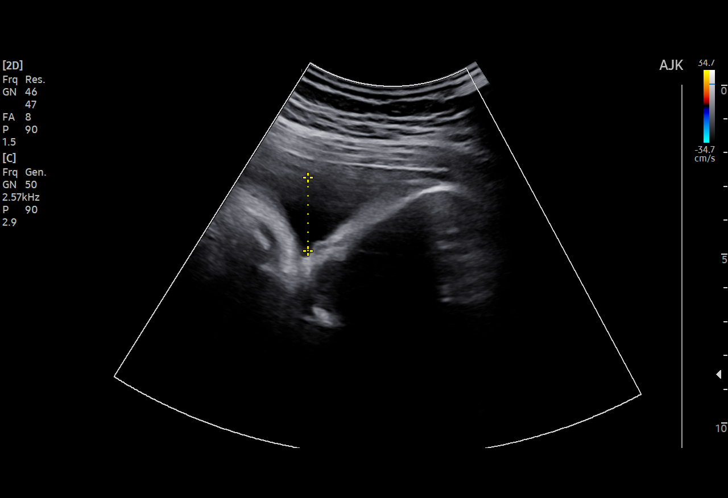
[im 19/34]
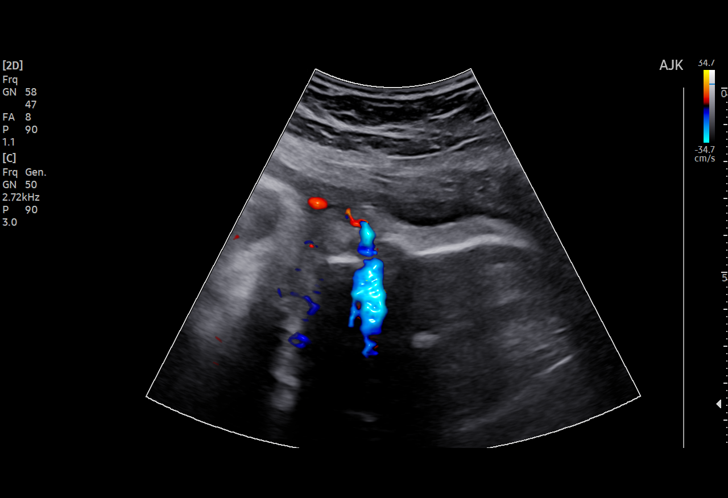
[im 21/34]
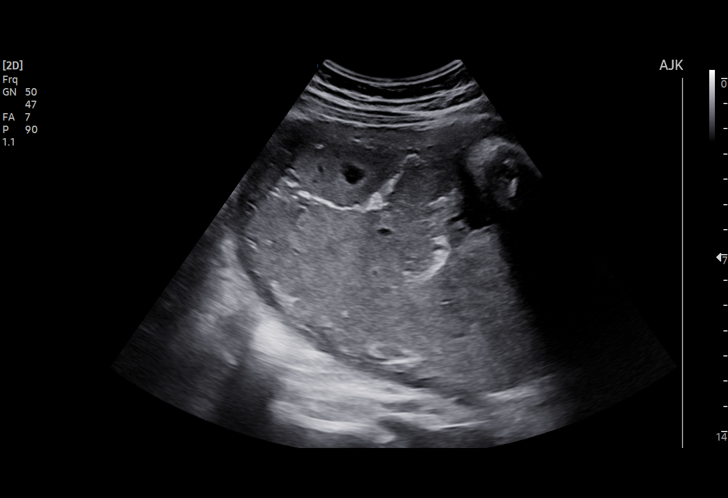
[im 24/34]
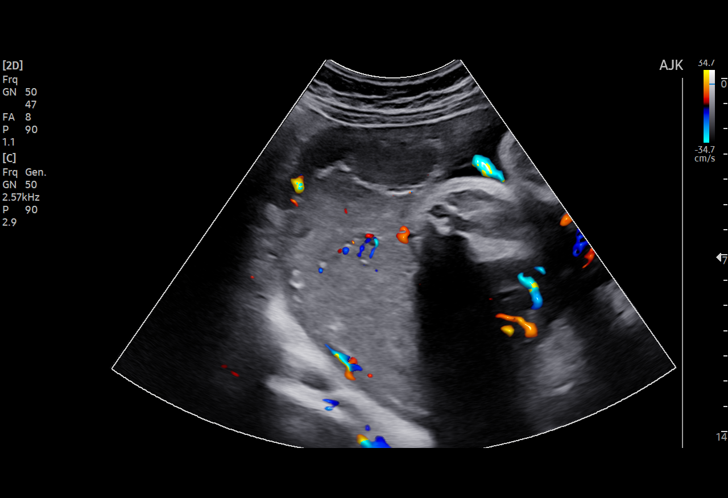
[im 26/34]
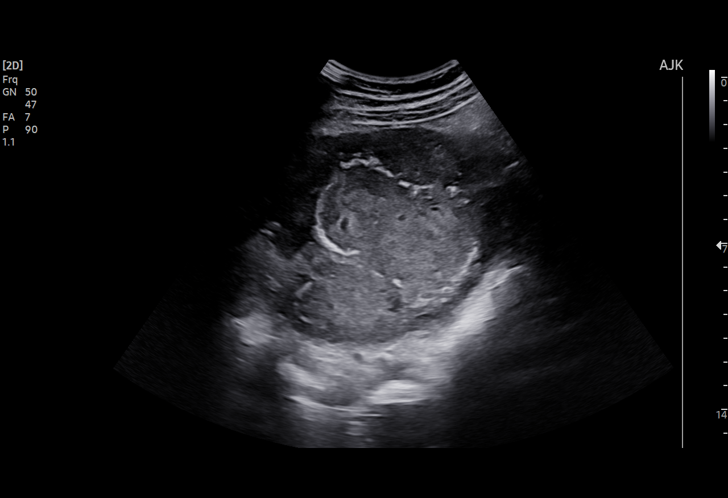
[im 29/34]
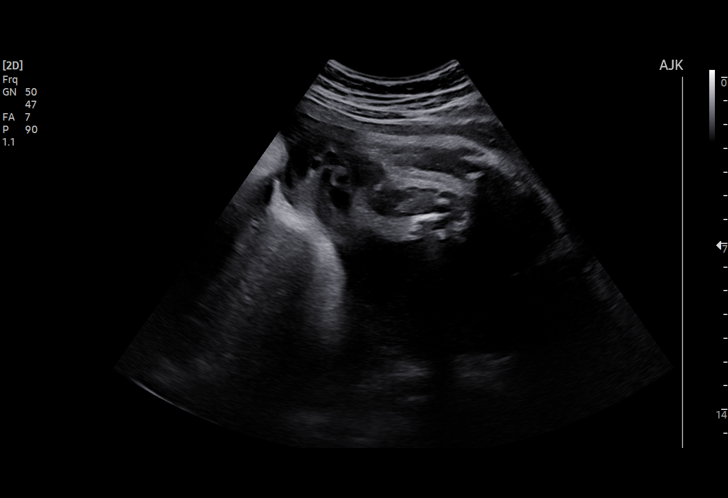
[im 31/34]
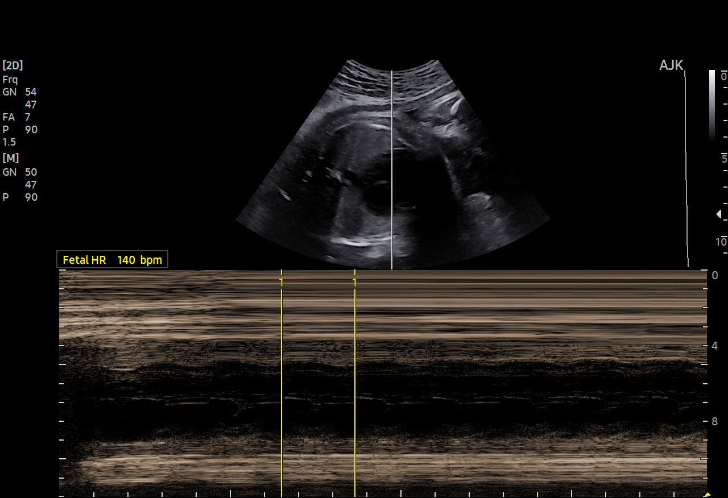
[im 34/34]
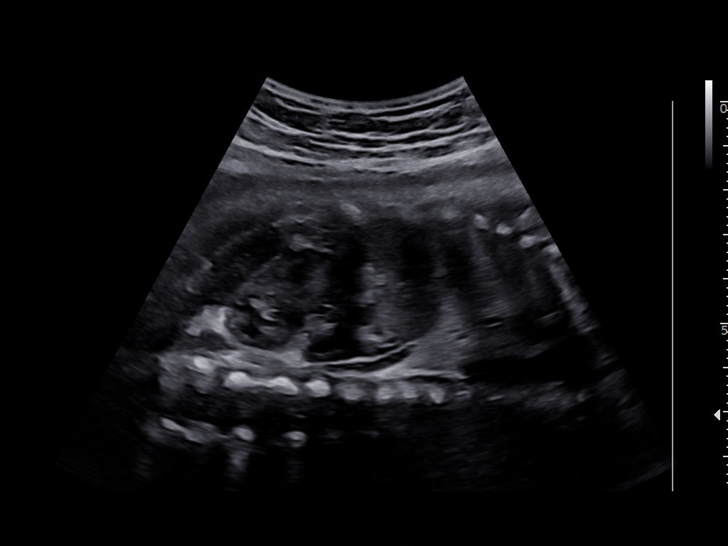

[15 of 28 positions shown; findings below may reference images not displayed]

[REDACTED]

    STRESS                                            [place]

Indications

 36 weeks gestation of pregnancy
 Decreased fetal movement
 Gestational hypertension without significant
 proteinuria, third trimester
 Low Risk NIPS

## 2023-06-21 ENCOUNTER — Encounter (HOSPITAL_COMMUNITY): Payer: Self-pay | Admitting: Emergency Medicine

## 2023-06-21 ENCOUNTER — Ambulatory Visit (HOSPITAL_COMMUNITY)
Admission: EM | Admit: 2023-06-21 | Discharge: 2023-06-21 | Disposition: A | Discharge status: Home / Self Care | Payer: Medicaid Other | Attending: Emergency Medicine | Admitting: Emergency Medicine

## 2023-06-21 DIAGNOSIS — N3000 Acute cystitis without hematuria: Secondary | ICD-10-CM

## 2023-06-21 LAB — POCT URINALYSIS DIP (MANUAL ENTRY)
Bilirubin, UA: NEGATIVE
Glucose, UA: NEGATIVE mg/dL
Ketones, POC UA: NEGATIVE mg/dL
Nitrite, UA: NEGATIVE
Protein Ur, POC: NEGATIVE mg/dL
Spec Grav, UA: 1.015 (ref 1.010–1.025)
Urobilinogen, UA: 0.2 U/dL
pH, UA: 7 (ref 5.0–8.0)

## 2023-06-21 MED ORDER — SULFAMETHOXAZOLE-TRIMETHOPRIM 800-160 MG PO TABS
1.0000 | ORAL_TABLET | Freq: Two times a day (BID) | ORAL | 0 refills | Status: AC
Start: 2023-06-21 — End: 2023-06-24

## 2023-06-21 MED ORDER — PHENAZOPYRIDINE HCL 95 MG PO TABS: 95.0000 mg | ORAL_TABLET | Freq: Three times a day (TID) | ORAL | 0 refills | Status: DC | PRN

## 2023-06-21 NOTE — Discharge Instructions (Signed)
Your urine showed that you have a urinary tract infection.  Please take all antibiotics as prescribed and until finished, take them twice daily with food over the next 3 days.  You can also take the Azo as needed for dysuria, this may change the color of your secretions.  Ensure you are drinking at least 64 ounces of water daily and avoiding urinary irritants like soda, juices and caffeine.  Return to clinic for any new or urgent symptoms.

## 2023-06-21 NOTE — ED Provider Notes (Signed)
[facility]    CSN: [id number] Arrival date & time: 06/21/23  1340      History   Chief Complaint Chief Complaint  Patient presents with   Urinary Urgency    HPI Sonya Harris is a 22 y.o. female.   Patient presents to clinic for complaints of lower abdominal discomfort that is intermittent as well as feelings of incomplete emptying and urinary urgency that have been present for the past 2 weeks.  She did try AZO which helped her symptoms earlier this week.  Denies any fevers, nausea, vomiting or flank pain.  Denies any changes to her vaginal discharge or concern for STIs.  She recently had a menstrual cycle on the 23rd.  She took a home a urinary tract infection test which showed leukocytes.     The history is provided by the patient and medical records.    Past Medical History:  Diagnosis Date   Pregnancy induced hypertension    Seizures (HCC)    22 yr old    Patient Active Problem List   Diagnosis Date Noted   Retained products of conception following abortion 10/20/2022   Encounter for contraceptive management 10/20/2022   Constipation 04/02/2022   Postpartum care and examination 04/02/2022   Postpartum anemia 03/14/2022   Blood pressure check 03/05/2022   Anemia 03/05/2022   Gestational thrombocytopenia (HCC) 03/03/2022   Gestational hypertension 02/28/2022   Gestational hypertension affecting first pregnancy 02/21/2022   Supervision of normal first pregnancy 09/04/2021   Allergic rhinitis 05/01/2012   ECZEMA, ATOPIC DERMATITIS 11/20/2006    Past Surgical History:  Procedure Laterality Date   SP ARTHRO THUMB*R*      OB History     Gravida  2   Para  1   Term  1   Preterm      AB  1   Living  1      SAB      IAB      Ectopic      Multiple  0   Live Births  1            Home Medications    Prior to Admission medications   Medication Sig Start Date End Date Taking? Authorizing Provider  phenazopyridine  (PYRIDIUM) 95 MG tablet Take 1 tablet (95 mg total) by mouth 3 (three) times daily as needed for pain. 06/21/23  Yes Rinaldo Ratel, [place] N, FNP  sulfamethoxazole-trimethoprim (BACTRIM DS) 800-160 MG tablet Take 1 tablet by mouth 2 (two) times daily for 3 days. 06/21/23 06/24/23 Yes Rinaldo Ratel, [place] N, FNP  Ferrous Fumarate (HEMOCYTE - 106 MG FE) 324 (106 Fe) MG TABS tablet Take 1 tablet (106 mg of iron total) by mouth daily. 10/18/22   Valetta Close, MD  norgestimate-ethinyl estradiol (SPRINTEC 28) 0.25-35 MG-MCG tablet Take 1 tablet by mouth daily. 10/18/22   Valetta Close, MD  ondansetron (ZOFRAN-ODT) 4 MG disintegrating tablet Take 1 tablet (4 mg total) by mouth every 6 (six) hours as needed for nausea. Patient not taking: Reported on 10/24/2022 10/09/22   Venora Maples, MD  oxyCODONE (ROXICODONE) 5 MG immediate release tablet Take 1 tablet (5 mg total) by mouth every 4 (four) hours as needed for severe pain. Patient not taking: Reported on 10/24/2022 10/09/22   Venora Maples, MD  Prenatal Vit-Fe Fumarate-FA (PRENATAL MULTIVITAMIN) TABS tablet Take 1 tablet by mouth daily at 12 noon. Patient not taking: Reported on 10/24/2022 08/09/21   Dana Allan, MD  Family History Family History  Problem Relation Age of Onset   Hypertension Mother    Hypertension Maternal Aunt     Social History Social History   Tobacco Use   Smoking status: Never    Passive exposure: Yes   Smokeless tobacco: Never  Vaping Use   Vaping status: Never Used  Substance Use Topics   Alcohol use: Not Currently    Comment: not while preg   Drug use: Never     Allergies   Patient has no known allergies.   Review of Systems Review of Systems  Genitourinary:  Positive for frequency and urgency. Negative for dysuria and flank pain.     Physical Exam Triage Vital Signs ED Triage Vitals [06/21/23 1404]  Encounter Vitals Group     BP 121/76     Systolic BP Percentile      Diastolic BP Percentile       Pulse Rate 94     Resp 16     Temp 98.3 F (36.8 C)     Temp Source Oral     SpO2 98 %     Weight      Height      Head Circumference      Peak Flow      Pain Score      Pain Loc      Pain Education      Exclude from Growth Chart    No data found.  Updated Vital Signs BP 121/76 (BP Location: Left Arm)   Pulse 94   Temp 98.3 F (36.8 C) (Oral)   Resp 16   LMP 06/16/2023   SpO2 98%   Visual Acuity Right Eye Distance:   Left Eye Distance:   Bilateral Distance:    Right Eye Near:   Left Eye Near:    Bilateral Near:     Physical Exam Vitals and nursing note reviewed.  Constitutional:      Appearance: Normal appearance.  HENT:     Head: Normocephalic and atraumatic.     Right Ear: External ear normal.     Left Ear: External ear normal.     Nose: Nose normal.     Mouth/Throat:     Mouth: Mucous membranes are moist.  Eyes:     Conjunctiva/sclera: Conjunctivae normal.  Cardiovascular:     Rate and Rhythm: Normal rate.  Pulmonary:     Effort: Pulmonary effort is normal. No respiratory distress.  Abdominal:     Tenderness: There is no right CVA tenderness or left CVA tenderness.  Skin:    General: Skin is warm and dry.  Neurological:     General: No focal deficit present.     Mental Status: She is alert and oriented to person, place, and time.  Psychiatric:        Mood and Affect: Mood normal.        Behavior: Behavior normal.      UC Treatments / Results  Labs (all labs ordered are listed, but only abnormal results are displayed) Labs Reviewed  POCT URINALYSIS DIP (MANUAL ENTRY) - Abnormal; Notable for the following components:      Result Value   Blood, UA moderate (*)    Leukocytes, UA Trace (*)    All other components within normal limits  URINE CULTURE    EKG   Radiology No results found.  Procedures Procedures (including critical care time)  Medications Ordered in UC Medications - No data to display  Initial Impression /  Assessment and Plan / UC Course  I have reviewed the triage vital signs and the nursing notes.  Pertinent labs & imaging results that were available during my care of the patient were reviewed by me and considered in my medical decision making (see chart for details).  Vitals and triage reviewed, patient is hemodynamically stable.  Negative for CVA tenderness, without nausea, vomiting or fever, low concern for pyelonephritis.  Urgency and frequency present.  UA shows blood and leukocytes, will send for culture and treat with Bactrim for 3 days for acute cystitis.  Plan of care, follow-up care and return precautions given, no questions at this time.     Final Clinical Impressions(s) / UC Diagnoses   Final diagnoses:  Acute cystitis without hematuria     Discharge Instructions      Your urine showed that you have a urinary tract infection.  Please take all antibiotics as prescribed and until finished, take them twice daily with food over the next 3 days.  You can also take the Azo as needed for dysuria, this may change the color of your secretions.  Ensure you are drinking at least 64 ounces of water daily and avoiding urinary irritants like soda, juices and caffeine.  Return to clinic for any new or urgent symptoms.     ED Prescriptions     Medication Sig Dispense Auth. Provider   sulfamethoxazole-trimethoprim (BACTRIM DS) 800-160 MG tablet Take 1 tablet by mouth 2 (two) times daily for 3 days. 6 tablet Rinaldo Ratel, [place] N, FNP   phenazopyridine (PYRIDIUM) 95 MG tablet Take 1 tablet (95 mg total) by mouth 3 (three) times daily as needed for pain. 10 tablet Shantana Christon, [place] N, FNP      PDMP not reviewed this encounter.   Conard Alvira, [place] N, [place] 06/21/23 1436

## 2023-06-21 NOTE — ED Triage Notes (Signed)
Pt reports that she lower abd pains that are intermittent as well as still having urge to urinate after she finishes urinating that have been almost 2 weeks. Tried taking AZO earlier this week for 2 days.

## 2023-06-22 LAB — URINE CULTURE: Culture: NO GROWTH

## 2023-08-04 ENCOUNTER — Telehealth: Payer: Self-pay

## 2023-08-04 ENCOUNTER — Ambulatory Visit (INDEPENDENT_AMBULATORY_CARE_PROVIDER_SITE_OTHER): Payer: Medicaid Other | Admitting: Student

## 2023-08-04 VITALS — BP 131/82 | HR 79 | Ht 65.0 in | Wt 158.4 lb

## 2023-08-04 DIAGNOSIS — R0789 Other chest pain: Secondary | ICD-10-CM

## 2023-08-04 NOTE — Telephone Encounter (Signed)
Call transferred from front office staff regarding scheduling patient appointment for chest pain.   Patient reports that she has been having intermittent tightness in chest and palpitations since Saturday.   She denies shortness of breath. She reports that she experienced similar symptoms several years ago. She was seen in urgent care on 11/26/19 with symptoms that were thought to be related to anxiety.   Scheduled for same day appointment this afternoon.   ED precautions discussed.   Veronda Prude, RN

## 2023-08-04 NOTE — Patient Instructions (Signed)
Ms. Cauthon,  My best guess is that these symptoms are due to nicotine withdrawals that should continue to get better with time. Based on our discussion today, I think we can give it a week and then see how you're feeling. If you're still having symptoms at that time, we can check and EKG and some lab work (looking at your hemoglobin and such) to see if there's another explanation for your symptoms.  Eliezer Mccoy, MD

## 2023-08-04 NOTE — Progress Notes (Unsigned)
    SUBJECTIVE:   CHIEF COMPLAINT / HPI:   Previously vaping.  Stopped about a week ago.  Palpitations on Saturday through to earlier today.  Right sided chest pain, not worse with a deep breath, maybe a bit in the middle.  - SOB  - Sweating + Anxiety   Had symptoms when vaping last time.  Nicotine.  Chooses not to be on meds for anxiety.      PERTINENT  PMH / PSH: ***  OBJECTIVE:   BP 131/82   Pulse 79   Ht 5\' 5"  (1.651 m)   Wt 158 lb 6.4 oz (71.8 kg)   SpO2 100%   BMI 26.36 kg/m   ***  ASSESSMENT/PLAN:   No problem-specific Assessment & Plan notes found for this encounter.     Eliezer Mccoy, MD [facility]

## 2023-08-05 DIAGNOSIS — R0789 Other chest pain: Secondary | ICD-10-CM | POA: Insufficient documentation

## 2023-08-05 NOTE — Assessment & Plan Note (Signed)
Very low concern for ischemic cardiac disease and this otherwise healthy 22 year old.  Her only real risk factors are a history of gestational hypertension with appropriate blood pressures off medication at present.  My leading suspicion in this case is for nicotine withdrawals causing her palpitations, also suspect that her anxiety may be playing into her symptoms somewhat, she is in agreement with this assessment.  Shared decision making today to not pursue EKG in clinic.  We will give her more time to work for the nicotine withdrawal period.  And if she is still having symptoms in a week, she will return for a more full some workup at that time.  Should her symptoms not resolve with the passage of time, I would consider obtaining an EKG and rechecking a CBC to ensure that anemia is not contributing.

## 2023-08-19 ENCOUNTER — Other Ambulatory Visit (HOSPITAL_COMMUNITY): Payer: Self-pay

## 2023-08-19 ENCOUNTER — Ambulatory Visit (INDEPENDENT_AMBULATORY_CARE_PROVIDER_SITE_OTHER): Payer: Medicaid Other | Admitting: Family Medicine

## 2023-08-19 DIAGNOSIS — Z309 Encounter for contraceptive management, unspecified: Secondary | ICD-10-CM | POA: Diagnosis present

## 2023-08-19 LAB — POCT URINE PREGNANCY: Preg Test, Ur: NEGATIVE

## 2023-08-19 MED ORDER — NORGESTIMATE-ETH ESTRADIOL 0.25-35 MG-MCG PO TABS
1.0000 | ORAL_TABLET | Freq: Every day | ORAL | 11 refills | Status: DC
Start: 2023-08-19 — End: 2024-08-09
  Filled 2023-08-19: qty 28, 28d supply, fill #0
  Filled 2023-09-16: qty 28, 28d supply, fill #1
  Filled 2023-10-10: qty 28, 28d supply, fill #2
  Filled 2023-10-31: qty 28, 28d supply, fill #3

## 2023-08-19 NOTE — Assessment & Plan Note (Addendum)
Preg test negative today. Prescribed sprintec which she was on before. Advised alternate form of contraception for the first month of being on sprintec. Additionally, to confirm negative pregnancy (given sexual activity after LMP) patient will need repeat pregnancy test in 2 weeks. Patient elected to take a home pregnancy test and send a mychart message with the result in 2 weeks.

## 2023-08-19 NOTE — Progress Notes (Signed)
    SUBJECTIVE:   CHIEF COMPLAINT / HPI:   Birth control Was on pill earlier this year but stopped - Sprintec worked well for her  LMP nov 18 Has been sexually active since then  Wishes to restart pill  No current concern for STDs Not currently breastfeeding  PERTINENT  PMH / PSH: [id number]  OBJECTIVE:   Ht 5\' 5"  (1.651 m)   Wt 159 lb 6 oz (72.3 kg)   LMP 08/11/2023   BMI 26.52 kg/m   General: NAD, pleasant, able to participate in exam Cardiac: RRR, no murmurs auscultated Respiratory: CTAB, normal WOB Abdomen: soft, non-tender, non-distended, normoactive bowel sounds Extremities: warm and well perfused, no edema or cyanosis Skin: warm and dry, no rashes noted Neuro: alert, no obvious focal deficits, speech normal Psych: Normal affect and mood  ASSESSMENT/PLAN:   Assessment & Plan Encounter for contraceptive management, unspecified type Preg test negative today. Prescribed sprintec which she was on before. Advised alternate form of contraception for the first month of being on sprintec. Additionally, to confirm negative pregnancy (given sexual activity after LMP) patient will need repeat pregnancy test in 2 weeks. Patient elected to take a home pregnancy test and send a mychart message with the result in 2 weeks.   Vonna Drafts, MD [facility]

## 2023-08-19 NOTE — Patient Instructions (Addendum)
I have attached information about Sprintec and sent this to your pharmacy  For the next month, please use an alternate form of birth control such as condoms  Please confirm your negative pregnancy test in 2 weeks. Please send a mychart message with your test result on 09/02/2023

## 2023-08-26 ENCOUNTER — Other Ambulatory Visit (HOSPITAL_COMMUNITY): Payer: Self-pay

## 2023-09-02 ENCOUNTER — Ambulatory Visit (HOSPITAL_COMMUNITY)
Admission: EM | Admit: 2023-09-02 | Discharge: 2023-09-02 | Disposition: A | Discharge status: Home / Self Care | Payer: Medicaid Other | Attending: Internal Medicine | Admitting: Internal Medicine

## 2023-09-02 ENCOUNTER — Encounter (HOSPITAL_COMMUNITY): Payer: Self-pay

## 2023-09-02 DIAGNOSIS — N390 Urinary tract infection, site not specified: Secondary | ICD-10-CM | POA: Diagnosis not present

## 2023-09-02 DIAGNOSIS — N898 Other specified noninflammatory disorders of vagina: Secondary | ICD-10-CM | POA: Insufficient documentation

## 2023-09-02 DIAGNOSIS — Z113 Encounter for screening for infections with a predominantly sexual mode of transmission: Secondary | ICD-10-CM | POA: Diagnosis not present

## 2023-09-02 DIAGNOSIS — R1030 Lower abdominal pain, unspecified: Secondary | ICD-10-CM | POA: Diagnosis not present

## 2023-09-02 LAB — POCT URINALYSIS DIP (MANUAL ENTRY)
Bilirubin, UA: NEGATIVE
Glucose, UA: NEGATIVE mg/dL
Ketones, POC UA: NEGATIVE mg/dL
Nitrite, UA: POSITIVE — AB
Protein Ur, POC: 100 mg/dL — AB
Spec Grav, UA: >=1.03 — AB (ref 1.010–1.025)
Urobilinogen, UA: 0.2 U/dL
pH, UA: 7 (ref 5.0–8.0)

## 2023-09-02 LAB — POCT URINE PREGNANCY: Preg Test, Ur: NEGATIVE

## 2023-09-02 MED ORDER — LIDOCAINE HCL (PF) 1 % IJ SOLN
INTRAMUSCULAR | Status: AC
  Filled 2023-09-02: qty 2

## 2023-09-02 MED ORDER — FLUCONAZOLE 150 MG PO TABS
150.0000 mg | ORAL_TABLET | ORAL | 0 refills | Status: AC
Start: 2023-09-02 — End: 2023-09-10

## 2023-09-02 MED ORDER — CEFTRIAXONE SODIUM 1 G IJ SOLR
1.0000 g | Freq: Once | INTRAMUSCULAR | Status: AC
  Administered 2023-09-02: 1 g via INTRAMUSCULAR

## 2023-09-02 MED ORDER — CEFTRIAXONE SODIUM 1 G IJ SOLR
INTRAMUSCULAR | Status: AC
  Filled 2023-09-02: qty 10

## 2023-09-02 MED ORDER — SULFAMETHOXAZOLE-TRIMETHOPRIM 800-160 MG PO TABS
1.0000 | ORAL_TABLET | Freq: Two times a day (BID) | ORAL | 0 refills | Status: AC
Start: 2023-09-02 — End: 2023-09-09

## 2023-09-02 NOTE — ED Provider Notes (Signed)
[facility]    CSN: [id number] Arrival date & time: 09/02/23  1828      History   Chief Complaint Chief Complaint  Patient presents with   Abdominal Pain   Burning with Urination     HPI Sonya Harris is a 22 y.o. female.   Patient presents today with a several day history of urinary symptoms.  She reports urgency, frequency, lower abdominal pain.  She does have some vaginal discharge that she describes as thick and white.  She did self treat for a yeast infection several weeks ago with over-the-counter Monistat.  Her symptoms improved but then recurred.  She denies any changes to personal hygiene products including soaps or detergents.  She has no concern for pregnancy.  She denies history of recurrent UTI, recent urogenital procedure, self-catheterization.  She denies history of diabetes does not take SGLT2 inhibitor.  She has no concern for STI but is open to complete STI panel.  She reports lower abdominal discomfort is rated 3 on a 0-10 pain scale, described as aching, worse with duration, no alleviating factors identified.    Past Medical History:  Diagnosis Date   Pregnancy induced hypertension    Seizures (HCC)    22 yr old    Patient Active Problem List   Diagnosis Date Noted   Atypical chest pain 08/05/2023   Retained products of conception following abortion 10/20/2022   Encounter for contraceptive management 10/20/2022   Constipation 04/02/2022   Postpartum care and examination 04/02/2022   Postpartum anemia 03/14/2022   Blood pressure check 03/05/2022   Anemia 03/05/2022   Gestational thrombocytopenia (HCC) 03/03/2022   Gestational hypertension 02/28/2022   Gestational hypertension affecting first pregnancy 02/21/2022   Supervision of normal first pregnancy 09/04/2021   Allergic rhinitis 05/01/2012   ECZEMA, ATOPIC DERMATITIS 11/20/2006    Past Surgical History:  Procedure Laterality Date   SP ARTHRO THUMB*R*      OB History     Gravida   2   Para  1   Term  1   Preterm      AB  1   Living  1      SAB      IAB      Ectopic      Multiple  0   Live Births  1            Home Medications    Prior to Admission medications   Medication Sig Start Date End Date Taking? Authorizing Provider  fluconazole (DIFLUCAN) 150 MG tablet Take 1 tablet (150 mg total) by mouth once a week for 2 doses. 09/02/23 09/10/23 Yes Talik Casique K, PA-C  sulfamethoxazole-trimethoprim (BACTRIM DS) 800-160 MG tablet Take 1 tablet by mouth 2 (two) times daily for 7 days. 09/02/23 09/09/23 Yes Ural Acree, Noberto Retort, PA-C  Ferrous Fumarate (HEMOCYTE - 106 MG FE) 324 (106 Fe) MG TABS tablet Take 1 tablet (106 mg of iron total) by mouth daily. 10/18/22   Valetta Close, MD  norgestimate-ethinyl estradiol (SPRINTEC 28) 0.25-35 MG-MCG tablet Take 1 tablet by mouth daily. 08/19/23   Vonna Drafts, MD  ondansetron (ZOFRAN-ODT) 4 MG disintegrating tablet Take 1 tablet (4 mg total) by mouth every 6 (six) hours as needed for nausea. Patient not taking: Reported on 10/24/2022 10/09/22   Venora Maples, MD  oxyCODONE (ROXICODONE) 5 MG immediate release tablet Take 1 tablet (5 mg total) by mouth every 4 (four) hours as needed for severe pain. Patient not taking:  Reported on 10/24/2022 10/09/22   Venora Maples, MD  Prenatal Vit-Fe Fumarate-FA (PRENATAL MULTIVITAMIN) TABS tablet Take 1 tablet by mouth daily at 12 noon. Patient not taking: Reported on 10/24/2022 08/09/21   Dana Allan, MD    Family History Family History  Problem Relation Age of Onset   Hypertension Mother    Hypertension Maternal Aunt     Social History Social History   Tobacco Use   Smoking status: Never    Passive exposure: Yes   Smokeless tobacco: Never  Vaping Use   Vaping status: Never Used  Substance Use Topics   Alcohol use: Not Currently    Comment: not while preg   Drug use: Never     Allergies   Patient has no known allergies.   Review of  Systems Review of Systems  Constitutional:  Positive for activity change. Negative for appetite change, fatigue and fever.  Respiratory:  Negative for cough and shortness of breath.   Cardiovascular:  Negative for chest pain.  Gastrointestinal:  Positive for abdominal pain. Negative for diarrhea, nausea and vomiting.  Genitourinary:  Positive for frequency, urgency and vaginal discharge. Negative for dysuria, flank pain, pelvic pain, vaginal bleeding and vaginal pain.  Musculoskeletal:  Negative for arthralgias, back pain and myalgias.     Physical Exam Triage Vital Signs ED Triage Vitals  Encounter Vitals Group     BP 09/02/23 1920 129/76     Systolic BP Percentile --      Diastolic BP Percentile --      Pulse Rate 09/02/23 1920 80     Resp 09/02/23 1920 18     Temp 09/02/23 1920 98.3 F (36.8 C)     Temp Source 09/02/23 1920 Oral     SpO2 09/02/23 1920 98 %     Weight 09/02/23 1917 158 lb (71.7 kg)     Height 09/02/23 1917 5\' 5"  (1.651 m)     Head Circumference --      Peak Flow --      Pain Score 09/02/23 1917 3     Pain Loc --      Pain Education --      Exclude from Growth Chart --    No data found.  Updated Vital Signs BP 129/86 (BP Location: Left Arm)   Pulse 80   Temp 98.3 F (36.8 C) (Oral)   Resp 18   Ht 5\' 5"  (1.651 m)   Wt 158 lb (71.7 kg)   LMP 08/11/2023 (Exact Date)   SpO2 98%   Breastfeeding No   BMI 26.29 kg/m   Visual Acuity Right Eye Distance:   Left Eye Distance:   Bilateral Distance:    Right Eye Near:   Left Eye Near:    Bilateral Near:     Physical Exam Vitals reviewed.  Constitutional:      General: She is awake. She is not in acute distress.    Appearance: Normal appearance. She is well-developed. She is not ill-appearing.     Comments: Very pleasant female appears stated age in no acute distress sitting comfortably in exam room  HENT:     Head: Normocephalic and atraumatic.  Cardiovascular:     Rate and Rhythm: Normal rate  and regular rhythm.     Heart sounds: Normal heart sounds, S1 normal and S2 normal. No murmur heard. Pulmonary:     Effort: Pulmonary effort is normal.     Breath sounds: Normal breath sounds. No wheezing, rhonchi or rales.  Comments: Clear to auscultation bilaterally Abdominal:     General: Bowel sounds are normal.     Palpations: Abdomen is soft.     Tenderness: There is abdominal tenderness in the suprapubic area. There is no right CVA tenderness, left CVA tenderness, guarding or rebound.  Psychiatric:        Behavior: Behavior is cooperative.      UC Treatments / Results  Labs (all labs ordered are listed, but only abnormal results are displayed) Labs Reviewed  POCT URINALYSIS DIP (MANUAL ENTRY) - Abnormal; Notable for the following components:      Result Value   Clarity, UA cloudy (*)    Spec Grav, UA >=1.030 (*)    Blood, UA large (*)    Protein Ur, POC =100 (*)    Nitrite, UA Positive (*)    Leukocytes, UA Small (1+) (*)    All other components within normal limits  URINE CULTURE  POCT URINE PREGNANCY  CERVICOVAGINAL ANCILLARY ONLY    EKG   Radiology No results found.  Procedures Procedures (including critical care time)  Medications Ordered in UC Medications  cefTRIAXone (ROCEPHIN) injection 1 g (has no administration in time range)    Initial Impression / Assessment and Plan / UC Course  I have reviewed the triage vital signs and the nursing notes.  Pertinent labs & imaging results that were available during my care of the patient were reviewed by me and considered in my medical decision making (see chart for details).     Patient is well-appearing, afebrile, nontoxic, nontachycardic.  Vital signs and physical exam are reassuring with no indication for emergent evaluation or imaging.  UA consistent with UTI.  Urine pregnancy was negative.  She did have some tenderness in her back but no significant CVA tenderness.  Given severity of her symptoms we  will start Rocephin 1 g in clinic today and begin Bactrim DS to cover for pyelonephritis.  Urine culture was obtained and is pending.  We will contact her if we need to change or discontinue her antibiotics based on culture results.  She does report some associated vaginal discharge consistent with yeast infection.  Will treat with 1 dose of Diflucan now.  She was given a second dose to take in 1 week after she finishes antibiotics.  STI swab was collected and we will contact her if need to return additional treatment.  Recommended close follow-up with her primary care.  We discussed that if anything worsens or changes she needs to be evaluated immediately.  Strict return precautions given.  Work excuse note provided.  Final Clinical Impressions(s) / UC Diagnoses   Final diagnoses:  Acute UTI  Lower abdominal pain  Vaginal discharge  Screening examination for STI     Discharge Instructions      We are treating you for a urinary tract infection.  Start Bactrim DS twice daily for 1 week.  Make sure you rest and drink plenty of fluid.  Take Diflucan today and a second dose in 1 week.  We will contact you if any to arrange additional treatment based on your swab result.  If anything worsens and you have worsening pain, fever, nausea, vomiting you should go to the emergency room.     ED Prescriptions     Medication Sig Dispense Auth. Provider   sulfamethoxazole-trimethoprim (BACTRIM DS) 800-160 MG tablet Take 1 tablet by mouth 2 (two) times daily for 7 days. 14 tablet Krysteena Stalker K, PA-C   fluconazole (DIFLUCAN)  150 MG tablet Take 1 tablet (150 mg total) by mouth once a week for 2 doses. 2 tablet Jesenya Bowditch, Noberto Retort, PA-C      PDMP not reviewed this encounter.   Jeani Hawking, PA-C 09/02/23 1949

## 2023-09-02 NOTE — ED Triage Notes (Signed)
Pt presents to urgent care with abdominal cramping since last night. Pt is also reporting burning with urination and vaginal discharge. Pt states she believes she has yeast. About two weeks ago, pt began to have discharge and bought OTC "3 day fix for yeast infection" at [employer]. Pt states she had temporary improvement however the discharge has returned now. Pt currently rates her pain a 3/10.

## 2023-09-02 NOTE — Discharge Instructions (Addendum)
We are treating you for a urinary tract infection.  Start Bactrim DS twice daily for 1 week.  Make sure you rest and drink plenty of fluid.  Take Diflucan today and a second dose in 1 week.  We will contact you if any to arrange additional treatment based on your swab result.  If anything worsens and you have worsening pain, fever, nausea, vomiting you should go to the emergency room.

## 2023-09-03 LAB — CERVICOVAGINAL ANCILLARY ONLY
Bacterial Vaginitis (gardnerella): NEGATIVE
Candida Glabrata: NEGATIVE
Candida Vaginitis: POSITIVE — AB
Chlamydia: NEGATIVE
Comment: NEGATIVE
Comment: NEGATIVE
Comment: NEGATIVE
Comment: NEGATIVE
Comment: NEGATIVE
Comment: NORMAL
Neisseria Gonorrhea: NEGATIVE
Trichomonas: NEGATIVE

## 2023-09-04 LAB — URINE CULTURE: Culture: 80000 — AB

## 2023-09-16 ENCOUNTER — Other Ambulatory Visit (HOSPITAL_COMMUNITY): Payer: Self-pay

## 2023-10-10 ENCOUNTER — Other Ambulatory Visit (HOSPITAL_COMMUNITY): Payer: Self-pay

## 2023-10-24 ENCOUNTER — Ambulatory Visit (INDEPENDENT_AMBULATORY_CARE_PROVIDER_SITE_OTHER): Payer: Medicaid Other | Admitting: Student

## 2023-10-24 ENCOUNTER — Other Ambulatory Visit (HOSPITAL_COMMUNITY)
Admission: RE | Admit: 2023-10-24 | Discharge: 2023-10-24 | Disposition: A | Discharge status: Home / Self Care | Payer: Medicaid Other | Source: Ambulatory Visit | Attending: Family Medicine | Admitting: Family Medicine

## 2023-10-24 ENCOUNTER — Other Ambulatory Visit (HOSPITAL_COMMUNITY): Payer: Self-pay

## 2023-10-24 VITALS — BP 120/76 | HR 78 | Ht 64.0 in | Wt 165.0 lb

## 2023-10-24 DIAGNOSIS — B3731 Acute candidiasis of vulva and vagina: Secondary | ICD-10-CM

## 2023-10-24 DIAGNOSIS — R1084 Generalized abdominal pain: Secondary | ICD-10-CM | POA: Insufficient documentation

## 2023-10-24 LAB — POCT WET PREP (WET MOUNT)
Clue Cells Wet Prep Whiff POC: NEGATIVE
Trichomonas Wet Prep HPF POC: ABSENT
WBC, Wet Prep HPF POC: >20

## 2023-10-24 LAB — POCT URINE PREGNANCY: Preg Test, Ur: NEGATIVE

## 2023-10-24 MED ORDER — FLUCONAZOLE 150 MG PO TABS
ORAL_TABLET | ORAL | 0 refills | Status: AC
Start: 2023-10-24 — End: ?
  Filled 2023-10-24: qty 2, 3d supply, fill #0

## 2023-10-24 NOTE — Patient Instructions (Addendum)
It was great to see you today! Thank you for choosing [facility] for your primary care.  Today we addressed: You have a large presence of yeast which I suspect is the main driver of your symptoms. I would recommend returning for further conversation regarding your contraceptive management.  If you haven't already, sign up for My Chart to have easy access to your labs results, and communication with your primary care physician.  Return if symptoms worsen or fail to improve. Please arrive 15 minutes before your appointment to ensure smooth check in process.  We appreciate your efforts in making this happen.  Thank you for allowing me to participate in your care, Shelby Mattocks, DO 10/24/2023, 4:07 PM PGY-3, [facility]

## 2023-10-24 NOTE — Progress Notes (Unsigned)
  SUBJECTIVE:   CHIEF COMPLAINT / HPI:   Vaginal bleeding: Started 2-3 days ago, pink discharge-light period. Small clots. Taking Vylibra, not missed doses. LMP 10/03/23. She has regular periods. Denies fevers. She occasionally gets low right back pain (dull, not intense) which comes and goes throughout the day. It does feel like a light menstrual cramp. It started 2-3 days ago. She also takes iron tablets. Since being on OCPs, she feels like she is getting regular yeast infections. She is unsure when she started this birth control. She is sexually active, not concerned of STDs.   PERTINENT  PMH / PSH: Eczema  OBJECTIVE:  BP 120/76   Pulse 78   Ht 5\' 4"  (1.626 m)   Wt 165 lb (74.8 kg)   LMP 10/03/2023 (Exact Date)   SpO2 99%   BMI 28.32 kg/m  Pelvic exam: VULVA: normal appearing vulva with no masses, tenderness or lesions, VAGINA: normal appearing vagina with normal color and discharge, no lesions, vaginal discharge - white, curd-like, and thick, WET MOUNT done - results: {:[id number]}, DNA probe for chlamydia and GC obtained, CERVIX: {details:[id number]::normal appearing cervix without discharge or lesions}.   ASSESSMENT/PLAN:   Assessment & Plan Generalized abdominal pain  No follow-ups on file. Shelby Mattocks, DO 10/24/2023, 3:59 PM PGY-3, [facility] {    This will disappear when note is signed, click to select method of visit    :1}

## 2023-10-28 ENCOUNTER — Encounter: Payer: Self-pay | Admitting: Student

## 2023-10-28 LAB — CERVICOVAGINAL ANCILLARY ONLY
Chlamydia: NEGATIVE
Comment: NEGATIVE
Comment: NORMAL
Neisseria Gonorrhea: NEGATIVE

## 2023-10-31 ENCOUNTER — Other Ambulatory Visit (HOSPITAL_COMMUNITY): Payer: Self-pay

## 2024-08-09 ENCOUNTER — Encounter: Payer: Self-pay | Admitting: Student

## 2024-08-09 ENCOUNTER — Ambulatory Visit (INDEPENDENT_AMBULATORY_CARE_PROVIDER_SITE_OTHER): Admitting: Student

## 2024-08-09 ENCOUNTER — Other Ambulatory Visit (HOSPITAL_COMMUNITY): Payer: Self-pay

## 2024-08-09 VITALS — BP 129/81 | HR 92 | Ht 64.0 in | Wt 182.6 lb

## 2024-08-09 DIAGNOSIS — S0502XA Injury of conjunctiva and corneal abrasion without foreign body, left eye, initial encounter: Secondary | ICD-10-CM | POA: Diagnosis present

## 2024-08-09 DIAGNOSIS — Z309 Encounter for contraceptive management, unspecified: Secondary | ICD-10-CM | POA: Diagnosis not present

## 2024-08-09 LAB — POCT URINE PREGNANCY: Preg Test, Ur: NEGATIVE

## 2024-08-09 MED ORDER — OFLOXACIN 0.3 % OP SOLN
1.0000 [drp] | Freq: Four times a day (QID) | OPHTHALMIC | 0 refills | Status: AC
  Filled 2024-08-09: qty 5, 25d supply, fill #0

## 2024-08-09 MED ORDER — NORGESTIMATE-ETH ESTRADIOL 0.25-35 MG-MCG PO TABS
1.0000 | ORAL_TABLET | Freq: Every day | ORAL | 11 refills | Status: AC
  Filled 2024-08-09: qty 28, 28d supply, fill #0

## 2024-08-09 NOTE — Patient Instructions (Signed)
 Pleasure to see you today.  Your pregnancy test today was negative. I have restarted you on your birth control Sprintec 's.  Your eye test today showed that you have a corneal abrasion.  I have sent in antibiotics eyedrop which you will apply 4 times a day on the left eye for 5 days.

## 2024-08-09 NOTE — Progress Notes (Signed)
    SUBJECTIVE:   CHIEF COMPLAINT / HPI:   Sonya Harris is a 23 year old female who presents with a dry cough, early menstrual cycle, and eye injury.  She has had a persistent dry cough for the past week, which worsens when the fan is off, causing difficulty breathing. She experiences a stuffy nose upon waking but denies runny nose, fever, chills, or heartburn.  Her menstrual cycle began early on the eleventh, typically starting on the eighteenth. She is sexually active, uses condoms inconsistently, and has not taken a pregnancy test. She is concerned about possible pregnancy, having initially thought the early bleeding might be implantation bleeding.  Her two-year-old child scratched her eye last night, resulting in redness, soreness, and blurry vision with floaters. There is no double vision.  PERTINENT  PMH / PSH: Reviewed   OBJECTIVE:   BP 129/81   Pulse 92   Ht 5' 4 (1.626 m)   Wt 182 lb 9.6 oz (82.8 kg)   SpO2 99%   BMI 31.34 kg/m    Physical Exam General: Alert, well appearing, NAD Eye: Left Cornea erythema with no conjunctival injection.  Pupils equal and reactive to light Cardiovascular: RRR, No Murmurs, Normal S2/S2 Respiratory: CTAB, No wheezing or Rales Abdomen: No distension or tenderness   ASSESSMENT/PLAN:    Contraceptive management Concerned about early menstrual cycle onset. No recent pregnancy test. - Ordered urine pregnancy test. - Restart Sprintec  if pregnancy test is negative.  Left eye corneal abrasion Right eye redness and subconjunctival hemorrhage after scratch. Reports blurry vision, no floaters.  Fluorescein stain showed corneal abrasion. - Rx ophthalmic ofloxacin  Acute dry cough and throat irritation Dry cough and throat irritation for one week, likely viral. No fever or chills. - Advised warm water, honey, and lemon for throat irritation. - Monitor symptoms for potential viral infection.  Norleen April, MD [facility]

## 2024-08-18 ENCOUNTER — Other Ambulatory Visit (HOSPITAL_COMMUNITY): Payer: Self-pay
